# Patient Record
Sex: Female | Born: 1955
Health system: Southern US, Community
[De-identification: ages and names within clinical notes are randomized; demographics above are authoritative.]

## PROBLEM LIST (undated history)

## (undated) DIAGNOSIS — G8929 Other chronic pain: Secondary | ICD-10-CM

## (undated) DIAGNOSIS — E079 Disorder of thyroid, unspecified: Secondary | ICD-10-CM

## (undated) DIAGNOSIS — M549 Dorsalgia, unspecified: Secondary | ICD-10-CM

## (undated) DIAGNOSIS — I10 Essential (primary) hypertension: Secondary | ICD-10-CM

## (undated) HISTORY — PX: CHOLECYSTECTOMY: SHX55

## (undated) HISTORY — PX: TONSILECTOMY/ADENOIDECTOMY WITH MYRINGOTOMY: SHX6125

---

## 2001-05-30 ENCOUNTER — Emergency Department (HOSPITAL_COMMUNITY): Admission: EM | Admit: 2001-05-30 | Discharge: 2001-05-30 | Payer: Self-pay | Admitting: Emergency Medicine

## 2001-06-10 ENCOUNTER — Encounter: Payer: Self-pay | Admitting: Physical Medicine and Rehabilitation

## 2001-06-10 ENCOUNTER — Ambulatory Visit (HOSPITAL_COMMUNITY)
Admission: RE | Admit: 2001-06-10 | Discharge: 2001-06-10 | Payer: Self-pay | Admitting: Physical Medicine and Rehabilitation

## 2004-09-06 ENCOUNTER — Emergency Department (HOSPITAL_COMMUNITY): Admission: EM | Admit: 2004-09-06 | Discharge: 2004-09-06 | Payer: Self-pay | Admitting: Family Medicine

## 2006-07-06 ENCOUNTER — Emergency Department (HOSPITAL_COMMUNITY): Admission: EM | Admit: 2006-07-06 | Discharge: 2006-07-06 | Payer: Self-pay | Admitting: Emergency Medicine

## 2006-08-17 ENCOUNTER — Emergency Department (HOSPITAL_COMMUNITY): Admission: EM | Admit: 2006-08-17 | Discharge: 2006-08-17 | Payer: Self-pay | Admitting: Emergency Medicine

## 2007-06-06 ENCOUNTER — Encounter: Admission: RE | Admit: 2007-06-06 | Discharge: 2007-06-06 | Payer: Self-pay | Admitting: Internal Medicine

## 2007-08-20 ENCOUNTER — Emergency Department (HOSPITAL_COMMUNITY): Admission: EM | Admit: 2007-08-20 | Discharge: 2007-08-20 | Payer: Self-pay | Admitting: Emergency Medicine

## 2008-04-07 ENCOUNTER — Emergency Department (HOSPITAL_COMMUNITY): Admission: EM | Admit: 2008-04-07 | Discharge: 2008-04-07 | Payer: Self-pay | Admitting: Emergency Medicine

## 2013-02-15 ENCOUNTER — Emergency Department (HOSPITAL_COMMUNITY)
Admission: EM | Admit: 2013-02-15 | Discharge: 2013-02-16 | Disposition: A | Discharge status: Home / Self Care | Payer: No Typology Code available for payment source | Attending: Emergency Medicine | Admitting: Emergency Medicine

## 2013-02-15 DIAGNOSIS — Y9389 Activity, other specified: Secondary | ICD-10-CM | POA: Insufficient documentation

## 2013-02-15 DIAGNOSIS — IMO0002 Reserved for concepts with insufficient information to code with codable children: Secondary | ICD-10-CM | POA: Insufficient documentation

## 2013-02-15 DIAGNOSIS — Z862 Personal history of diseases of the blood and blood-forming organs and certain disorders involving the immune mechanism: Secondary | ICD-10-CM | POA: Insufficient documentation

## 2013-02-15 DIAGNOSIS — Z8639 Personal history of other endocrine, nutritional and metabolic disease: Secondary | ICD-10-CM | POA: Insufficient documentation

## 2013-02-15 DIAGNOSIS — Y92410 Unspecified street and highway as the place of occurrence of the external cause: Secondary | ICD-10-CM | POA: Insufficient documentation

## 2013-02-15 DIAGNOSIS — I10 Essential (primary) hypertension: Secondary | ICD-10-CM | POA: Insufficient documentation

## 2013-02-15 DIAGNOSIS — Z79899 Other long term (current) drug therapy: Secondary | ICD-10-CM | POA: Insufficient documentation

## 2013-02-15 DIAGNOSIS — M62838 Other muscle spasm: Secondary | ICD-10-CM | POA: Insufficient documentation

## 2013-02-15 DIAGNOSIS — M62830 Muscle spasm of back: Secondary | ICD-10-CM

## 2013-02-15 HISTORY — DX: Disorder of thyroid, unspecified: E07.9

## 2013-02-15 HISTORY — DX: Essential (primary) hypertension: I10
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
## 2013-02-16 ENCOUNTER — Encounter (HOSPITAL_COMMUNITY): Payer: Self-pay

## 2013-02-16 ENCOUNTER — Emergency Department (HOSPITAL_COMMUNITY): Payer: No Typology Code available for payment source

## 2013-02-16 MED ORDER — IBUPROFEN 800 MG PO TABS: 800.0000 mg | ORAL_TABLET | Freq: Three times a day (TID) | ORAL | Status: DC

## 2013-02-16 MED ORDER — BLISTEX EX OINT
TOPICAL_OINTMENT | CUTANEOUS | Status: DC | PRN
  Filled 2013-02-16: qty 10

## 2013-02-16 MED ORDER — DIAZEPAM 5 MG PO TABS: 5.0000 mg | ORAL_TABLET | Freq: Three times a day (TID) | ORAL | Status: DC | PRN

## 2013-02-16 MED ORDER — DIAZEPAM 5 MG PO TABS
5.0000 mg | ORAL_TABLET | Freq: Once | ORAL | Status: AC
  Administered 2013-02-16: 5 mg via ORAL
  Filled 2013-02-16: qty 1

## 2013-02-16 MED ORDER — ACETAMINOPHEN 325 MG PO TABS
650.0000 mg | ORAL_TABLET | Freq: Once | ORAL | Status: AC
  Administered 2013-02-16: 650 mg via ORAL
  Filled 2013-02-16: qty 2

## 2013-02-16 MED ORDER — LIP MEDEX EX OINT
TOPICAL_OINTMENT | CUTANEOUS | Status: DC | PRN
  Administered 2013-02-16: 01:00:00 via TOPICAL
  Filled 2013-02-16: qty 7
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
## 2013-03-02 ENCOUNTER — Emergency Department (HOSPITAL_COMMUNITY): Payer: No Typology Code available for payment source

## 2013-03-02 ENCOUNTER — Emergency Department (HOSPITAL_COMMUNITY)
Admission: EM | Admit: 2013-03-02 | Discharge: 2013-03-03 | Disposition: A | Discharge status: Home / Self Care | Payer: No Typology Code available for payment source | Attending: Emergency Medicine | Admitting: Emergency Medicine

## 2013-03-02 ENCOUNTER — Encounter (HOSPITAL_COMMUNITY): Payer: Self-pay

## 2013-03-02 DIAGNOSIS — I10 Essential (primary) hypertension: Secondary | ICD-10-CM | POA: Insufficient documentation

## 2013-03-02 DIAGNOSIS — R059 Cough, unspecified: Secondary | ICD-10-CM | POA: Insufficient documentation

## 2013-03-02 DIAGNOSIS — Z8639 Personal history of other endocrine, nutritional and metabolic disease: Secondary | ICD-10-CM | POA: Insufficient documentation

## 2013-03-02 DIAGNOSIS — R05 Cough: Secondary | ICD-10-CM | POA: Insufficient documentation

## 2013-03-02 DIAGNOSIS — S298XXA Other specified injuries of thorax, initial encounter: Secondary | ICD-10-CM | POA: Insufficient documentation

## 2013-03-02 DIAGNOSIS — S6990XA Unspecified injury of unspecified wrist, hand and finger(s), initial encounter: Secondary | ICD-10-CM | POA: Insufficient documentation

## 2013-03-02 DIAGNOSIS — R0789 Other chest pain: Secondary | ICD-10-CM

## 2013-03-02 DIAGNOSIS — Z79899 Other long term (current) drug therapy: Secondary | ICD-10-CM | POA: Insufficient documentation

## 2013-03-02 DIAGNOSIS — Z862 Personal history of diseases of the blood and blood-forming organs and certain disorders involving the immune mechanism: Secondary | ICD-10-CM | POA: Insufficient documentation

## 2013-03-02 DIAGNOSIS — Y9389 Activity, other specified: Secondary | ICD-10-CM | POA: Insufficient documentation

## 2013-03-02 DIAGNOSIS — Z7982 Long term (current) use of aspirin: Secondary | ICD-10-CM | POA: Insufficient documentation

## 2013-03-02 DIAGNOSIS — IMO0002 Reserved for concepts with insufficient information to code with codable children: Secondary | ICD-10-CM | POA: Insufficient documentation

## 2013-03-02 DIAGNOSIS — G8929 Other chronic pain: Secondary | ICD-10-CM | POA: Insufficient documentation

## 2013-03-02 DIAGNOSIS — M549 Dorsalgia, unspecified: Secondary | ICD-10-CM

## 2013-03-02 DIAGNOSIS — Y92410 Unspecified street and highway as the place of occurrence of the external cause: Secondary | ICD-10-CM | POA: Insufficient documentation

## 2013-03-02 DIAGNOSIS — M79622 Pain in left upper arm: Secondary | ICD-10-CM

## 2013-03-02 DIAGNOSIS — S59909A Unspecified injury of unspecified elbow, initial encounter: Secondary | ICD-10-CM | POA: Insufficient documentation

## 2013-03-02 HISTORY — DX: Other chronic pain: G89.29

## 2013-03-02 HISTORY — DX: Dorsalgia, unspecified: M54.9
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
## 2013-03-03 MED ORDER — IBUPROFEN 800 MG PO TABS: 800.0000 mg | ORAL_TABLET | Freq: Three times a day (TID) | ORAL | Status: DC

## 2013-03-03 MED ORDER — DIAZEPAM 5 MG PO TABS: 5.0000 mg | ORAL_TABLET | Freq: Two times a day (BID) | ORAL | Status: DC

## 2013-03-03 NOTE — ED Provider Notes (Signed)
Medical screening examination/treatment/procedure(s) were performed by non-physician practitioner and as supervising physician I was immediately available for consultation/collaboration.   Husam Hohn E Charlestine Rookstool, MD 03/03/13 2138 

## 2013-04-22 ENCOUNTER — Encounter (HOSPITAL_COMMUNITY): Payer: Self-pay | Admitting: Emergency Medicine

## 2013-04-22 ENCOUNTER — Emergency Department (HOSPITAL_COMMUNITY)
Admission: EM | Admit: 2013-04-22 | Discharge: 2013-04-22 | Disposition: A | Discharge status: Home / Self Care | Payer: No Typology Code available for payment source | Attending: Emergency Medicine | Admitting: Emergency Medicine

## 2013-04-22 ENCOUNTER — Emergency Department (HOSPITAL_COMMUNITY): Payer: No Typology Code available for payment source

## 2013-04-22 DIAGNOSIS — G8911 Acute pain due to trauma: Secondary | ICD-10-CM | POA: Insufficient documentation

## 2013-04-22 DIAGNOSIS — M25469 Effusion, unspecified knee: Secondary | ICD-10-CM | POA: Insufficient documentation

## 2013-04-22 DIAGNOSIS — M549 Dorsalgia, unspecified: Secondary | ICD-10-CM | POA: Insufficient documentation

## 2013-04-22 DIAGNOSIS — M25539 Pain in unspecified wrist: Secondary | ICD-10-CM | POA: Insufficient documentation

## 2013-04-22 DIAGNOSIS — M25562 Pain in left knee: Secondary | ICD-10-CM

## 2013-04-22 DIAGNOSIS — Z791 Long term (current) use of non-steroidal anti-inflammatories (NSAID): Secondary | ICD-10-CM | POA: Insufficient documentation

## 2013-04-22 DIAGNOSIS — M25532 Pain in left wrist: Secondary | ICD-10-CM

## 2013-04-22 DIAGNOSIS — Z79899 Other long term (current) drug therapy: Secondary | ICD-10-CM | POA: Insufficient documentation

## 2013-04-22 DIAGNOSIS — I10 Essential (primary) hypertension: Secondary | ICD-10-CM | POA: Insufficient documentation

## 2013-04-22 DIAGNOSIS — M25569 Pain in unspecified knee: Secondary | ICD-10-CM | POA: Insufficient documentation

## 2013-04-22 DIAGNOSIS — G8929 Other chronic pain: Secondary | ICD-10-CM | POA: Insufficient documentation

## 2013-04-22 NOTE — ED Provider Notes (Signed)
Medical screening examination/treatment/procedure(s) were performed by non-physician practitioner and as supervising physician I was immediately available for consultation/collaboration.    Celene Kras, MD 04/22/13 [phone]
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
## 2013-05-28 ENCOUNTER — Encounter (INDEPENDENT_AMBULATORY_CARE_PROVIDER_SITE_OTHER): Payer: Self-pay

## 2013-05-28 ENCOUNTER — Encounter: Payer: Self-pay | Admitting: Internal Medicine

## 2013-05-28 ENCOUNTER — Ambulatory Visit: Payer: Self-pay | Attending: Internal Medicine | Admitting: Internal Medicine

## 2013-05-28 VITALS — BP 134/91 | HR 84 | Temp 98.2°F | Resp 16 | Ht 63.0 in | Wt 170.0 lb

## 2013-05-28 DIAGNOSIS — S62002S Unspecified fracture of navicular [scaphoid] bone of left wrist, sequela: Secondary | ICD-10-CM

## 2013-05-28 DIAGNOSIS — I10 Essential (primary) hypertension: Secondary | ICD-10-CM

## 2013-05-28 DIAGNOSIS — R42 Dizziness and giddiness: Secondary | ICD-10-CM

## 2013-05-28 DIAGNOSIS — S62009A Unspecified fracture of navicular [scaphoid] bone of unspecified wrist, initial encounter for closed fracture: Secondary | ICD-10-CM | POA: Insufficient documentation

## 2013-05-28 DIAGNOSIS — S42309S Unspecified fracture of shaft of humerus, unspecified arm, sequela: Secondary | ICD-10-CM

## 2013-05-28 LAB — CBC WITH DIFFERENTIAL/PLATELET
Eosinophils Absolute: 0.2 10*3/uL (ref 0.0–0.7)
Eosinophils Relative: 3 % (ref 0–5)
HCT: 35.7 % — ABNORMAL LOW (ref 36.0–46.0)
Lymphs Abs: 1.7 10*3/uL (ref 0.7–4.0)
MCH: 29.5 pg (ref 26.0–34.0)
MCV: 81.5 fL (ref 78.0–100.0)
Monocytes Absolute: 0.3 10*3/uL (ref 0.1–1.0)
Monocytes Relative: 5 % (ref 3–12)
Platelets: 307 10*3/uL (ref 150–400)
RBC: 4.38 MIL/uL (ref 3.87–5.11)

## 2013-05-28 LAB — CMP AND LIVER
ALT: 11 U/L (ref 0–35)
AST: 17 U/L (ref 0–37)
Albumin: 4.4 g/dL (ref 3.5–5.2)
Calcium: 9.6 mg/dL (ref 8.4–10.5)
Chloride: 104 mEq/L (ref 96–112)
Creat: 0.68 mg/dL (ref 0.50–1.10)
Potassium: 3.5 mEq/L (ref 3.5–5.3)

## 2013-05-28 LAB — T4, FREE: Free T4: 0.9 ng/dL (ref 0.80–1.80)

## 2013-05-28 LAB — TSH: TSH: 1.444 u[IU]/mL (ref 0.350–4.500)
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
## 2013-05-28 NOTE — Patient Instructions (Signed)
Back Pain, Adult Low back pain is very common. About 1 in 5 people have back pain.The cause of low back pain is rarely dangerous. The pain often gets better over time.About half of people with a sudden onset of back pain feel better in just 2 weeks. About 8 in 10 people feel better by 6 weeks.  CAUSES Some common causes of back pain include:  Strain of the muscles or ligaments supporting the spine.  Wear and tear (degeneration) of the spinal discs.  Arthritis.  Direct injury to the back. DIAGNOSIS Most of the time, the direct cause of low back pain is not known.However, back pain can be treated effectively even when the exact cause of the pain is unknown.Answering your caregiver's questions about your overall health and symptoms is one of the most accurate ways to make sure the cause of your pain is not dangerous. If your caregiver needs more information, he or she may order lab work or imaging tests (X-rays or MRIs).However, even if imaging tests show changes in your back, this usually does not require surgery. HOME CARE INSTRUCTIONS For many people, back pain returns.Since low back pain is rarely dangerous, it is often a condition that people can learn to manageon their own.   Remain active. It is stressful on the back to sit or stand in one place. Do not sit, drive, or stand in one place for more than 30 minutes at a time. Take short walks on level surfaces as soon as pain allows.Try to increase the length of time you walk each day.  Do not stay in bed.Resting more than 1 or 2 days can delay your recovery.  Do not avoid exercise or work.Your body is made to move.It is not dangerous to be active, even though your back may hurt.Your back will likely heal faster if you return to being active before your pain is gone.  Pay attention to your body when you bend and lift. Many people have less discomfortwhen lifting if they bend their knees, keep the load close to their bodies,and  avoid twisting. Often, the most comfortable positions are those that put less stress on your recovering back.  Find a comfortable position to sleep. Use a firm mattress and lie on your side with your knees slightly bent. If you lie on your back, put a pillow under your knees.  Only take over-the-counter or prescription medicines as directed by your caregiver. Over-the-counter medicines to reduce pain and inflammation are often the most helpful.Your caregiver may prescribe muscle relaxant drugs.These medicines help dull your pain so you can more quickly return to your normal activities and healthy exercise.  Put ice on the injured area.  Put ice in a plastic bag.  Place a towel between your skin and the bag.  Leave the ice on for 15-20 minutes, 03-04 times a day for the first 2 to 3 days. After that, ice and heat may be alternated to reduce pain and spasms.  Ask your caregiver about trying back exercises and gentle massage. This may be of some benefit.  Avoid feeling anxious or stressed.Stress increases muscle tension and can worsen back pain.It is important to recognize when you are anxious or stressed and learn ways to manage it.Exercise is a great option. SEEK MEDICAL CARE IF:  You have pain that is not relieved with rest or medicine.  You have pain that does not improve in 1 week.  You have new symptoms.  You are generally not feeling well. SEEK   IMMEDIATE MEDICAL CARE IF:   You have pain that radiates from your back into your legs.  You develop new bowel or bladder control problems.  You have unusual weakness or numbness in your arms or legs.  You develop nausea or vomiting.  You develop abdominal pain.  You feel faint. Document Released: 07/09/2005 Document Revised: 01/08/2012 Document Reviewed: 11/27/2010 [facility] Patient Information 2014 [place], [place]. Exercise to Lose Weight Exercise and a healthy diet may help you lose weight. Your doctor may suggest specific  exercises. EXERCISE IDEAS AND TIPS  Choose low-cost things you enjoy doing, such as walking, bicycling, or exercising to workout videos.  Take stairs instead of the elevator.  Walk during your lunch break.  Park your car further away from work or school.  Go to a gym or an exercise class.  Start with 5 to 10 minutes of exercise each day. Build up to 30 minutes of exercise 4 to 6 days a week.  Wear shoes with good support and comfortable clothes.  Stretch before and after working out.  Work out until you breathe harder and your heart beats faster.  Drink extra water when you exercise.  Do not do so much that you hurt yourself, feel dizzy, or get very short of breath. Exercises that burn about 150 calories:  Running 1  miles in 15 minutes.  Playing volleyball for 45 to 60 minutes.  Washing and waxing a car for 45 to 60 minutes.  Playing touch football for 45 minutes.  Walking 1  miles in 35 minutes.  Pushing a stroller 1  miles in 30 minutes.  Playing basketball for 30 minutes.  Raking leaves for 30 minutes.  Bicycling 5 miles in 30 minutes.  Walking 2 miles in 30 minutes.  Dancing for 30 minutes.  Shoveling snow for 15 minutes.  Swimming laps for 20 minutes.  Walking up stairs for 15 minutes.  Bicycling 4 miles in 15 minutes.  Gardening for 30 to 45 minutes.  Jumping rope for 15 minutes.  Washing windows or floors for 45 to 60 minutes. Document Released: 08/11/2010 Document Revised: 10/01/2011 Document Reviewed: 08/11/2010 [facility] Patient Information 2014 [place], [place]. Hypertension As your heart beats, it forces blood through your arteries. This force is your blood pressure. If the pressure is too high, it is called hypertension (HTN) or high blood pressure. HTN is dangerous because you may have it and not know it. High blood pressure may mean that your heart has to work harder to pump blood. Your arteries may be narrow or stiff. The extra work  puts you at risk for heart disease, stroke, and other problems.  Blood pressure consists of two numbers, a higher number over a lower, 110/72, for example. It is stated as "110 over 72." The ideal is below 120 for the top number (systolic) and under 80 for the bottom (diastolic). Write down your blood pressure today. You should pay close attention to your blood pressure if you have certain conditions such as:  Heart failure.  Prior heart attack.  Diabetes  Chronic kidney disease.  Prior stroke.  Multiple risk factors for heart disease. To see if you have HTN, your blood pressure should be measured while you are seated with your arm held at the level of the heart. It should be measured at least twice. A one-time elevated blood pressure reading (especially in the Emergency Department) does not mean that you need treatment. There may be conditions in which the blood pressure is different between  your right and left arms. It is important to see your caregiver soon for a recheck. Most people have essential hypertension which means that there is not a specific cause. This type of high blood pressure may be lowered by changing lifestyle factors such as:  Stress.  Smoking.  Lack of exercise.  Excessive weight.  Drug/tobacco/alcohol use.  Eating less salt. Most people do not have symptoms from high blood pressure until it has caused damage to the body. Effective treatment can often prevent, delay or reduce that damage. TREATMENT  When a cause has been identified, treatment for high blood pressure is directed at the cause. There are a large number of medications to treat HTN. These fall into several categories, and your caregiver will help you select the medicines that are best for you. Medications may have side effects. You should review side effects with your caregiver. If your blood pressure stays high after you have made lifestyle changes or started on medicines,   Your medication(s) may  need to be changed.  Other problems may need to be addressed.  Be certain you understand your prescriptions, and know how and when to take your medicine.  Be sure to follow up with your caregiver within the time frame advised (usually within two weeks) to have your blood pressure rechecked and to review your medications.  If you are taking more than one medicine to lower your blood pressure, make sure you know how and at what times they should be taken. Taking two medicines at the same time can result in blood pressure that is too low. SEEK IMMEDIATE MEDICAL CARE IF:  You develop a severe headache, blurred or changing vision, or confusion.  You have unusual weakness or numbness, or a faint feeling.  You have severe chest or abdominal pain, vomiting, or breathing problems. MAKE SURE YOU:   Understand these instructions.  Will watch your condition.  Will get help right away if you are not doing well or get worse. Document Released: 07/09/2005 Document Revised: 10/01/2011 Document Reviewed: 02/27/2008 [facility] Patient Information 2014 [place], [place].

## 2013-05-29 ENCOUNTER — Telehealth: Payer: Self-pay | Admitting: Emergency Medicine

## 2013-05-29 LAB — URINALYSIS, COMPLETE
Bacteria, UA: NONE SEEN
Bilirubin Urine: NEGATIVE
Casts: NONE SEEN
Crystals: NONE SEEN
Glucose, UA: NEGATIVE mg/dL
Ketones, ur: NEGATIVE mg/dL
Specific Gravity, Urine: 1.012 (ref 1.005–1.030)
Squamous Epithelial / LPF: NONE SEEN
pH: 6 (ref 5.0–8.0)

## 2013-05-29 MED ORDER — VITAMIN D (ERGOCALCIFEROL) 1.25 MG (50000 UNIT) PO CAPS: 50000.0000 [IU] | ORAL_CAPSULE | ORAL | Status: AC

## 2013-05-29 NOTE — Telephone Encounter (Signed)
Attempted to call pt with results but no answer. Will retry on both lines listed

## 2013-05-29 NOTE — Telephone Encounter (Signed)
Message copied by Darlis Loan on Fri May 29, 2013  4:14 PM ------      Message from: Quentin Angst      Created: Fri May 29, 2013 12:33 PM       Please inform patient that most of her lab results are within normal limit. Her thyroid function is normal. However her vitamin D level is low, will need to start supplements with ergocalciferol capsule 50,000 unit weekly for 8 weeks. Please call in the prescription ------

## 2013-06-01 ENCOUNTER — Telehealth: Payer: Self-pay | Admitting: Emergency Medicine

## 2013-06-01 NOTE — Telephone Encounter (Signed)
Pt called requesting lab results. Pt given results and told to pick script Vitamin D med up from pharmacy

## 2013-06-26 ENCOUNTER — Ambulatory Visit: Payer: Self-pay | Attending: Internal Medicine | Admitting: Internal Medicine

## 2013-06-26 ENCOUNTER — Encounter: Payer: Self-pay | Admitting: Internal Medicine

## 2013-06-26 VITALS — BP 128/87 | HR 78 | Temp 98.7°F | Resp 14 | Ht 62.0 in | Wt 169.4 lb

## 2013-06-26 DIAGNOSIS — M25539 Pain in unspecified wrist: Secondary | ICD-10-CM | POA: Insufficient documentation

## 2013-06-26 DIAGNOSIS — G894 Chronic pain syndrome: Secondary | ICD-10-CM

## 2013-06-26 DIAGNOSIS — M25519 Pain in unspecified shoulder: Secondary | ICD-10-CM | POA: Insufficient documentation

## 2013-06-26 MED ORDER — MELOXICAM 15 MG PO TABS: 15.0000 mg | ORAL_TABLET | Freq: Every day | ORAL | Status: DC

## 2013-06-26 MED ORDER — DULOXETINE HCL 30 MG PO CPEP: 30.0000 mg | ORAL_CAPSULE | Freq: Every day | ORAL | Status: DC

## 2013-06-26 NOTE — Progress Notes (Signed)
Pt is here for a f/u visit. Complain of Lt breast or chest pain x5 months; comes and goes. Recent chest xray determined muscle pull. Lt shoulder pain. Only taking BC for pain. Have to sit down after doing heavy work.
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
## 2013-06-29 ENCOUNTER — Telehealth: Payer: Self-pay

## 2013-06-29 NOTE — Telephone Encounter (Signed)
Sherry from [facility] called  Stated they did not receive specimen in an SST Unable to run the lab Reference # [id number]

## 2013-06-30 ENCOUNTER — Telehealth: Payer: Self-pay | Admitting: Internal Medicine

## 2013-06-30 NOTE — Telephone Encounter (Signed)
Pt called regarding her INR, Pt would like a call back from the Nurse

## 2013-07-01 ENCOUNTER — Telehealth: Payer: Self-pay | Admitting: Emergency Medicine

## 2013-07-01 ENCOUNTER — Telehealth: Payer: Self-pay

## 2013-07-01 NOTE — Telephone Encounter (Signed)
Spoke with miss Courts this am She is aware of her scheduled appt at radiology On 07/09/13 at 3pm Arrive 2:45

## 2013-07-01 NOTE — Telephone Encounter (Signed)
Pt given CT/MRI scheduling number

## 2013-07-02 ENCOUNTER — Ambulatory Visit: Payer: Self-pay | Admitting: Internal Medicine

## 2013-07-09 ENCOUNTER — Ambulatory Visit (HOSPITAL_COMMUNITY): Payer: No Typology Code available for payment source

## 2013-07-09 ENCOUNTER — Ambulatory Visit (HOSPITAL_COMMUNITY)
Admission: RE | Admit: 2013-07-09 | Discharge: 2013-07-09 | Disposition: A | Discharge status: Home / Self Care | Payer: No Typology Code available for payment source | Source: Ambulatory Visit | Attending: Internal Medicine | Admitting: Internal Medicine

## 2013-07-09 ENCOUNTER — Ambulatory Visit: Payer: Self-pay

## 2013-07-09 DIAGNOSIS — G894 Chronic pain syndrome: Secondary | ICD-10-CM

## 2013-07-13 ENCOUNTER — Other Ambulatory Visit (HOSPITAL_COMMUNITY): Payer: Self-pay | Admitting: *Deleted

## 2013-07-13 DIAGNOSIS — N644 Mastodynia: Secondary | ICD-10-CM

## 2013-07-14 ENCOUNTER — Ambulatory Visit: Payer: Self-pay | Admitting: Internal Medicine

## 2013-07-20 ENCOUNTER — Telehealth: Payer: Self-pay | Admitting: Internal Medicine

## 2013-07-20 DIAGNOSIS — M545 Low back pain: Secondary | ICD-10-CM

## 2013-07-20 NOTE — Telephone Encounter (Signed)
Pt called in today to see if she can have an order put in to [facility] imaging for an open screening; pt said she could not handle the closed MRI and would like to try this method instead

## 2013-07-21 ENCOUNTER — Encounter (HOSPITAL_COMMUNITY): Payer: Self-pay

## 2013-07-21 ENCOUNTER — Other Ambulatory Visit (HOSPITAL_COMMUNITY)
Admission: RE | Admit: 2013-07-21 | Discharge: 2013-07-21 | Disposition: A | Discharge status: Home / Self Care | Payer: No Typology Code available for payment source | Source: Ambulatory Visit | Attending: Obstetrics and Gynecology | Admitting: Obstetrics and Gynecology

## 2013-07-21 ENCOUNTER — Ambulatory Visit (HOSPITAL_COMMUNITY)
Admission: RE | Admit: 2013-07-21 | Discharge: 2013-07-21 | Disposition: A | Discharge status: Home / Self Care | Payer: No Typology Code available for payment source | Source: Ambulatory Visit | Attending: Obstetrics and Gynecology | Admitting: Obstetrics and Gynecology

## 2013-07-21 VITALS — BP 128/84 | Temp 98.0°F | Ht 62.5 in | Wt 169.4 lb

## 2013-07-21 DIAGNOSIS — Z01419 Encounter for gynecological examination (general) (routine) without abnormal findings: Secondary | ICD-10-CM
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
## 2013-07-21 NOTE — Patient Instructions (Signed)
Taught Jade Cunningham how to perform BSE and gave educational materials to take home. Let her know [facility] will cover Pap smears every 3 years unless has a history of abnormal Pap smears. Referred patient to the [facility] for diagnostic mammogram. Appointment scheduled for Monday, July 27, 2013 at 1345. Patient aware of appointment and will be there. Let patient know will follow up with her within the next couple weeks with results by phone. Theodoro Clock verbalized understanding.  Kamilia Carollo, Kathaleen Maser, RN 2:43 PM

## 2013-07-27 ENCOUNTER — Ambulatory Visit
Admission: RE | Admit: 2013-07-27 | Discharge: 2013-07-27 | Disposition: A | Discharge status: Home / Self Care | Payer: No Typology Code available for payment source | Source: Ambulatory Visit | Attending: Obstetrics and Gynecology | Admitting: Obstetrics and Gynecology

## 2013-07-27 DIAGNOSIS — N644 Mastodynia: Secondary | ICD-10-CM

## 2013-07-31 ENCOUNTER — Telehealth: Payer: Self-pay

## 2013-07-31 NOTE — Telephone Encounter (Signed)
Please review lab results Patient has called for results

## 2013-08-03 ENCOUNTER — Other Ambulatory Visit: Payer: Self-pay

## 2013-08-05 NOTE — Telephone Encounter (Signed)
Called and informed that her PAP smear was negative. I discussed the  Importance of going ahead and finishing paperwork for orange card.

## 2013-08-12 ENCOUNTER — Telehealth: Payer: Self-pay | Admitting: Emergency Medicine

## 2013-08-12 MED ORDER — LORAZEPAM 1 MG PO TABS: 1.0000 mg | ORAL_TABLET | Freq: Once | ORAL | Status: DC

## 2013-08-12 NOTE — Addendum Note (Signed)
Addended by: Susie CassetteABROL MD, Germain OsgoodNAYANA on: 08/12/2013 10:53 AM   Modules accepted: Orders

## 2013-08-12 NOTE — Telephone Encounter (Signed)
Spoke with pt regarding upcoming MRI. States she needs open mri due to claustra phobia. Spoke with Dr. Susie CassetteAbrol, Ativan one dose ordered for scheduled test 08/24/13 @ 1145. Informed pt to pick medication up here prior to test. Pt verbalized understanding

## 2013-08-24 ENCOUNTER — Ambulatory Visit (HOSPITAL_COMMUNITY)
Admission: RE | Admit: 2013-08-24 | Discharge: 2013-08-24 | Disposition: A | Discharge status: Home / Self Care | Payer: No Typology Code available for payment source | Source: Ambulatory Visit | Attending: Internal Medicine | Admitting: Internal Medicine

## 2013-08-24 ENCOUNTER — Other Ambulatory Visit: Payer: Self-pay | Admitting: Emergency Medicine

## 2013-08-24 DIAGNOSIS — M47814 Spondylosis without myelopathy or radiculopathy, thoracic region: Secondary | ICD-10-CM | POA: Insufficient documentation

## 2013-08-24 DIAGNOSIS — M47817 Spondylosis without myelopathy or radiculopathy, lumbosacral region: Secondary | ICD-10-CM | POA: Insufficient documentation

## 2013-08-24 DIAGNOSIS — M4802 Spinal stenosis, cervical region: Secondary | ICD-10-CM | POA: Insufficient documentation

## 2013-08-24 DIAGNOSIS — G894 Chronic pain syndrome: Secondary | ICD-10-CM

## 2013-08-24 DIAGNOSIS — M5126 Other intervertebral disc displacement, lumbar region: Secondary | ICD-10-CM | POA: Insufficient documentation

## 2013-08-24 MED ORDER — LORAZEPAM 1 MG PO TABS
1.0000 mg | ORAL_TABLET | Freq: Once | ORAL | Status: AC
Start: 2013-08-24 — End: 2013-08-24
  Administered 2013-08-24: 1 mg via ORAL
  Filled 2013-08-24: qty 1

## 2013-08-24 MED ORDER — LORAZEPAM 1 MG PO TABS: 1.0000 mg | ORAL_TABLET | Freq: Once | ORAL | Status: DC

## 2013-08-25 NOTE — Addendum Note (Signed)
Addended by: Susie CassetteABROL MD, Germain OsgoodNAYANA on: 08/25/2013 09:48 AM   Modules accepted: Orders

## 2013-09-28 ENCOUNTER — Encounter (HOSPITAL_COMMUNITY): Payer: Self-pay

## 2013-11-17 ENCOUNTER — Ambulatory Visit: Payer: No Typology Code available for payment source | Attending: Internal Medicine

## 2013-11-20 ENCOUNTER — Encounter: Payer: Self-pay | Admitting: Family Medicine

## 2013-11-20 ENCOUNTER — Ambulatory Visit: Payer: Self-pay | Attending: Internal Medicine | Admitting: Family Medicine

## 2013-11-20 VITALS — BP 162/106 | HR 82 | Temp 98.4°F | Resp 20 | Ht 62.0 in | Wt 170.0 lb

## 2013-11-20 DIAGNOSIS — M545 Low back pain, unspecified: Secondary | ICD-10-CM | POA: Insufficient documentation

## 2013-11-20 DIAGNOSIS — R51 Headache: Secondary | ICD-10-CM | POA: Insufficient documentation

## 2013-11-20 DIAGNOSIS — I10 Essential (primary) hypertension: Secondary | ICD-10-CM | POA: Insufficient documentation

## 2013-11-20 DIAGNOSIS — Z79899 Other long term (current) drug therapy: Secondary | ICD-10-CM | POA: Insufficient documentation

## 2013-11-20 DIAGNOSIS — Z87828 Personal history of other (healed) physical injury and trauma: Secondary | ICD-10-CM | POA: Insufficient documentation

## 2013-11-20 DIAGNOSIS — M549 Dorsalgia, unspecified: Secondary | ICD-10-CM

## 2013-11-20 MED ORDER — GABAPENTIN 100 MG PO CAPS: 100.0000 mg | ORAL_CAPSULE | Freq: Every day | ORAL | Status: AC

## 2013-11-20 MED ORDER — VALSARTAN-HYDROCHLOROTHIAZIDE 80-12.5 MG PO TABS: 1.0000 | ORAL_TABLET | Freq: Every day | ORAL | Status: DC

## 2013-11-20 NOTE — Patient Instructions (Addendum)
For pain, you will be referred to Physical Therapy. You will be started on low dose of Gabapentin 100 mg.  Take one at night initially. Slowly build up until you are able to take 3 pills at night. Return to clinic in one month.  For your blood pressure it is really important to take your medication every day to prevent heart attacks and strokes  Good to see you today!  Thanks for coming in.
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
## 2013-11-20 NOTE — Progress Notes (Signed)
Pt here for chronic lumbar pain unrelieved by multiple medications and treatments States she needs neurology referral [facility] due to cost. States she went to neurologist and paid 200 out of pocket without treatment Pain is constant,nonradiating Need medication refill BP med. States she has been taking EOD to save.  BP elevated 162/106 82 headaches  noted but no dizziness Pt seems more concerned with back issues then blood pressure management
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
## 2013-12-07 ENCOUNTER — Ambulatory Visit: Payer: No Typology Code available for payment source | Attending: Family Medicine

## 2013-12-07 DIAGNOSIS — M25559 Pain in unspecified hip: Secondary | ICD-10-CM | POA: Insufficient documentation

## 2013-12-07 DIAGNOSIS — M545 Low back pain, unspecified: Secondary | ICD-10-CM | POA: Insufficient documentation

## 2013-12-07 DIAGNOSIS — IMO0001 Reserved for inherently not codable concepts without codable children: Secondary | ICD-10-CM | POA: Insufficient documentation

## 2013-12-07 DIAGNOSIS — M6281 Muscle weakness (generalized): Secondary | ICD-10-CM | POA: Insufficient documentation

## 2013-12-07 DIAGNOSIS — R5381 Other malaise: Secondary | ICD-10-CM | POA: Insufficient documentation

## 2013-12-09 ENCOUNTER — Ambulatory Visit: Payer: No Typology Code available for payment source | Admitting: Rehabilitation

## 2013-12-16 ENCOUNTER — Ambulatory Visit: Payer: No Typology Code available for payment source | Admitting: Rehabilitation

## 2013-12-18 ENCOUNTER — Ambulatory Visit: Payer: No Typology Code available for payment source

## 2013-12-22 ENCOUNTER — Ambulatory Visit: Payer: No Typology Code available for payment source | Attending: Family Medicine

## 2013-12-22 DIAGNOSIS — Z5189 Encounter for other specified aftercare: Secondary | ICD-10-CM | POA: Insufficient documentation

## 2013-12-22 DIAGNOSIS — M545 Low back pain, unspecified: Secondary | ICD-10-CM | POA: Insufficient documentation

## 2013-12-22 DIAGNOSIS — R5381 Other malaise: Secondary | ICD-10-CM | POA: Insufficient documentation

## 2013-12-22 DIAGNOSIS — M25559 Pain in unspecified hip: Secondary | ICD-10-CM | POA: Insufficient documentation

## 2013-12-22 DIAGNOSIS — M6281 Muscle weakness (generalized): Secondary | ICD-10-CM | POA: Insufficient documentation

## 2013-12-24 ENCOUNTER — Encounter (INDEPENDENT_AMBULATORY_CARE_PROVIDER_SITE_OTHER): Payer: Self-pay

## 2013-12-24 ENCOUNTER — Ambulatory Visit: Payer: No Typology Code available for payment source | Admitting: Rehabilitation

## 2013-12-24 ENCOUNTER — Encounter: Payer: Self-pay | Admitting: Internal Medicine

## 2013-12-24 ENCOUNTER — Ambulatory Visit: Payer: Self-pay | Attending: Internal Medicine | Admitting: Internal Medicine

## 2013-12-24 VITALS — BP 140/82 | HR 67 | Temp 98.3°F | Resp 16 | Ht 62.0 in | Wt 172.0 lb

## 2013-12-24 DIAGNOSIS — Z885 Allergy status to narcotic agent status: Secondary | ICD-10-CM | POA: Insufficient documentation

## 2013-12-24 DIAGNOSIS — Z888 Allergy status to other drugs, medicaments and biological substances status: Secondary | ICD-10-CM | POA: Insufficient documentation

## 2013-12-24 DIAGNOSIS — I10 Essential (primary) hypertension: Secondary | ICD-10-CM | POA: Insufficient documentation

## 2013-12-24 DIAGNOSIS — M549 Dorsalgia, unspecified: Secondary | ICD-10-CM | POA: Insufficient documentation

## 2013-12-24 DIAGNOSIS — G894 Chronic pain syndrome: Secondary | ICD-10-CM | POA: Insufficient documentation

## 2013-12-24 DIAGNOSIS — E079 Disorder of thyroid, unspecified: Secondary | ICD-10-CM | POA: Insufficient documentation

## 2013-12-24 MED ORDER — CYCLOBENZAPRINE HCL 10 MG PO TABS: 10.0000 mg | ORAL_TABLET | Freq: Three times a day (TID) | ORAL | Status: DC | PRN

## 2013-12-24 NOTE — Progress Notes (Signed)
Pt here for blood pressure check with medication management Blood pressure stable on Diovan C/o lower back. Being seen by outpt Rehab Refuses surgery or injection for chronic pain BP- 140/82

## 2013-12-24 NOTE — Patient Instructions (Signed)
Back Pain, Adult Low back pain is very common. About 1 in 5 people have back pain.The cause of low back pain is rarely dangerous. The pain often gets better over time.About half of people with a sudden onset of back pain feel better in just 2 weeks. About 8 in 10 people feel better by 6 weeks.  CAUSES Some common causes of back pain include:  Strain of the muscles or ligaments supporting the spine.  Wear and tear (degeneration) of the spinal discs.  Arthritis.  Direct injury to the back. DIAGNOSIS Most of the time, the direct cause of low back pain is not known.However, back pain can be treated effectively even when the exact cause of the pain is unknown.Answering your caregiver's questions about your overall health and symptoms is one of the most accurate ways to make sure the cause of your pain is not dangerous. If your caregiver needs more information, he or she may order lab work or imaging tests (X-rays or MRIs).However, even if imaging tests show changes in your back, this usually does not require surgery. HOME CARE INSTRUCTIONS For many people, back pain returns.Since low back pain is rarely dangerous, it is often a condition that people can learn to manageon their own.   Remain active. It is stressful on the back to sit or stand in one place. Do not sit, drive, or stand in one place for more than 30 minutes at a time. Take short walks on level surfaces as soon as pain allows.Try to increase the length of time you walk each day.  Do not stay in bed.Resting more than 1 or 2 days can delay your recovery.  Do not avoid exercise or work.Your body is made to move.It is not dangerous to be active, even though your back may hurt.Your back will likely heal faster if you return to being active before your pain is gone.  Pay attention to your body when you bend and lift. Many people have less discomfortwhen lifting if they bend their knees, keep the load close to their bodies,and  avoid twisting. Often, the most comfortable positions are those that put less stress on your recovering back.  Find a comfortable position to sleep. Use a firm mattress and lie on your side with your knees slightly bent. If you lie on your back, put a pillow under your knees.  Only take over-the-counter or prescription medicines as directed by your caregiver. Over-the-counter medicines to reduce pain and inflammation are often the most helpful.Your caregiver may prescribe muscle relaxant drugs.These medicines help dull your pain so you can more quickly return to your normal activities and healthy exercise.  Put ice on the injured area.  Put ice in a plastic bag.  Place a towel between your skin and the bag.  Leave the ice on for 15-20 minutes, 03-04 times a day for the first 2 to 3 days. After that, ice and heat may be alternated to reduce pain and spasms.  Ask your caregiver about trying back exercises and gentle massage. This may be of some benefit.  Avoid feeling anxious or stressed.Stress increases muscle tension and can worsen back pain.It is important to recognize when you are anxious or stressed and learn ways to manage it.Exercise is a great option. SEEK MEDICAL CARE IF:  You have pain that is not relieved with rest or medicine.  You have pain that does not improve in 1 week.  You have new symptoms.  You are generally not feeling well. SEEK   IMMEDIATE MEDICAL CARE IF:   You have pain that radiates from your back into your legs.  You develop new bowel or bladder control problems.  You have unusual weakness or numbness in your arms or legs.  You develop nausea or vomiting.  You develop abdominal pain.  You feel faint. Document Released: 07/09/2005 Document Revised: 01/08/2012 Document Reviewed: 11/27/2010 ExitCare Patient Information 2014 [employer].  

## 2013-12-24 NOTE — Progress Notes (Signed)
Patient ID: Jade Cunningham, female   DOB: 22-Aug-1955, 58 y.o.   MRN: [id number]   Jade Cunningham, is a 58 y.o. female  XMI:[id number]  MGN:[id number]  DOB - 04-19-1956  Chief Complaint  Patient presents with  . Follow-up  . Hypertension        Subjective:   Jade Cunningham is a 58 y.o. female here today for a follow up visit. Pt here for blood pressure check with medication management. Blood pressure stable on Diovan. C/o lower back. Being seen by outpt Rehab, Refuses surgery or injection for chronic pain. BP- 140/82 Patient has No headache, No chest pain, No abdominal pain - No Nausea, No new weakness tingling or numbness, No Cough - SOB.  Problem  Back Pain  Chronic Pain Syndrome    ALLERGIES: Allergies  Allergen Reactions  . 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 Nyquil Multi-Symptom [Pseudoeph-Doxylamine-Dm-Apap] Other (See Comments)    Trudie Buckler syndrome     PAST MEDICAL HISTORY: Past Medical History  Diagnosis Date  . Hypertension   . Thyroid disease   . Chronic back pain     MEDICATIONS AT HOME: Prior to Admission medications   Medication Sig Start Date End Date Taking? Authorizing Provider  COD LIVER OIL PO Take 1 capsule by mouth daily.   Yes Historical Provider, MD  gabapentin (NEURONTIN) 100 MG capsule Take 1 capsule (100 mg total) by mouth at bedtime. May increase by one capsule every 3-4 days until taking 3 every evening 11/20/13  Yes Carney Living, MD  valsartan-hydrochlorothiazide (DIOVAN-HCT) 80-12.5 MG per tablet Take 1 tablet by mouth daily. 11/20/13  Yes Carney Living, MD  Vitamin D, Ergocalciferol, (DRISDOL) 50000 UNITS CAPS capsule Take 1 capsule (50,000 Units total) by mouth every 7 (seven) days. 05/29/13  Yes Jeanann Lewandowsky, MD  acetaminophen (TYLENOL) 500 MG tablet Take 500 mg by mouth every 6 (six) hours as needed for pain.    Historical Provider, MD  cyclobenzaprine (FLEXERIL) 10 MG  tablet Take 1 tablet (10 mg total) by mouth 3 (three) times daily as needed for muscle spasms. 12/24/13   Jeanann Lewandowsky, MD     Objective:   Filed Vitals:   12/24/13 1724  BP: 140/82  Pulse: 67  Temp: 98.3 F (36.8 C)  TempSrc: Oral  Resp: 16  Height: 5\' 2"  (1.575 m)  Weight: 172 lb (78.019 kg)  SpO2: 100%    Exam General appearance : Awake, alert, not in any distress. Speech Clear. Not toxic looking HEENT: Atraumatic and Normocephalic, pupils equally reactive to light and accomodation Neck: supple, no JVD. No cervical lymphadenopathy.  Chest:Good air entry bilaterally, no added sounds  CVS: S1 S2 regular, no murmurs.  Abdomen: Bowel sounds present, Non tender and not distended with no gaurding, rigidity or rebound. Extremities: B/L Lower Ext shows no edema, both legs are warm to touch Neurology: Awake alert, and oriented X 3, CN II-XII intact, Non focal Skin:No Rash Wounds:N/A  Data Review No results found for this basename: HGBA1C     Assessment & Plan   1. Back pain Medication as prescribed  2. Chronic pain syndrome  - ANA - Acute Hep Panel & Hep B Surface Ab - cyclobenzaprine (FLEXERIL) 10 MG tablet; Take 1 tablet (10 mg total) by mouth 3 (three) times daily as needed for muscle spasms.  Dispense: 90 tablet; Refill: 3  Patient was counseled extensively about nutrition and exercise  Return in about  6 months (around 06/25/2014), or if symptoms worsen or fail to improve, for Follow up Pain and comorbidities.  The patient was given clear instructions to go to ER or return to medical center if symptoms don't improve, worsen or new problems develop. The patient verbalized understanding. The patient was told to call to get lab results if they haven't heard anything in the next week.   This note has been created with Education officer, environmentalDragon speech recognition software and smart phrase technology. Any transcriptional errors are unintentional.    Jeanann Lewandowskylugbemiga Dallen Bunte, MD, MHA, FACP,  [facility] [place], [place] [phone]   12/24/2013, 5:55 PM

## 2013-12-25 ENCOUNTER — Telehealth: Payer: Self-pay | Admitting: *Deleted

## 2013-12-25 LAB — ACUTE HEP PANEL AND HEP B SURFACE AB
HCV AB: NEGATIVE
HEP B C IGM: NONREACTIVE
Hep A IgM: NONREACTIVE
Hep B S Ab: NEGATIVE
Hepatitis B Surface Ag: NEGATIVE

## 2013-12-25 LAB — ANA: Anti Nuclear Antibody(ANA): NEGATIVE

## 2013-12-25 NOTE — Telephone Encounter (Signed)
Patient called stating she needs a note for her insurance company regarding the what type of back injury she has.

## 2013-12-25 NOTE — Telephone Encounter (Signed)
[name] to call back. Dr. Hyman Hopes is requesting more details about the exact need for the letter. Patient needs to have insurance company send [facility] a from of what is needed.

## 2013-12-25 NOTE — Telephone Encounter (Signed)
Please get more details from patient about the exact need for this letter, her insurance may need to send as a form to fill for her back pain.

## 2013-12-29 ENCOUNTER — Telehealth: Payer: Self-pay | Admitting: *Deleted

## 2013-12-29 NOTE — Telephone Encounter (Signed)
Patient notified of results. Discussed continuing physical therapy and pain medications to relieve back pain.

## 2013-12-29 NOTE — Telephone Encounter (Signed)
Message copied by Fredderick Severance on Tue Dec 29, 2013  1:03 PM ------      Message from: Quentin Angst      Created: Fri Dec 25, 2013  5:21 PM       Please inform patient that her autoimmune disease screening came back negative as well as her hepatitis panel also negative ------

## 2013-12-31 ENCOUNTER — Ambulatory Visit: Payer: Self-pay | Admitting: Internal Medicine

## 2014-01-05 ENCOUNTER — Ambulatory Visit: Payer: No Typology Code available for payment source | Admitting: Rehabilitation

## 2014-01-07 ENCOUNTER — Ambulatory Visit: Payer: No Typology Code available for payment source | Admitting: Rehabilitation

## 2014-01-11 ENCOUNTER — Ambulatory Visit: Payer: No Typology Code available for payment source | Admitting: Physical Therapy

## 2014-01-13 ENCOUNTER — Ambulatory Visit: Payer: No Typology Code available for payment source | Admitting: Physical Therapy

## 2014-01-20 ENCOUNTER — Telehealth: Payer: Self-pay | Admitting: *Deleted

## 2014-01-20 NOTE — Telephone Encounter (Signed)
Patient called to see if we can refer her to neurology again she is still having back pain and pain on her left leg. Patient was seen by neuro 09/2013. Please advise or follow up with patient.

## 2014-03-04 ENCOUNTER — Telehealth: Payer: Self-pay | Admitting: Internal Medicine

## 2014-03-04 DIAGNOSIS — S62002S Unspecified fracture of navicular [scaphoid] bone of left wrist, sequela: Secondary | ICD-10-CM

## 2014-03-04 NOTE — Telephone Encounter (Addendum)
Pt has been going to physical therapy for back pain and is requesting a referral to orthopedic to find out about other options.  Please f/u with pt, Pt available for call back until 5:00pm.

## 2014-03-05 ENCOUNTER — Telehealth: Payer: Self-pay | Admitting: Emergency Medicine

## 2014-03-05 NOTE — Telephone Encounter (Signed)
Pt called in requesting referral orthopedics for left hand pain with radiating to left arm with poor hand grasp. Please f/u

## 2014-03-15 ENCOUNTER — Ambulatory Visit: Payer: Self-pay | Attending: Internal Medicine | Admitting: Internal Medicine

## 2014-03-15 ENCOUNTER — Encounter: Payer: Self-pay | Admitting: Internal Medicine

## 2014-03-15 VITALS — BP 139/87 | HR 77 | Temp 98.4°F | Resp 18 | Ht 62.0 in | Wt 171.0 lb

## 2014-03-15 DIAGNOSIS — IMO0002 Reserved for concepts with insufficient information to code with codable children: Secondary | ICD-10-CM | POA: Insufficient documentation

## 2014-03-15 DIAGNOSIS — T148XXA Other injury of unspecified body region, initial encounter: Secondary | ICD-10-CM

## 2014-03-15 DIAGNOSIS — Z888 Allergy status to other drugs, medicaments and biological substances status: Secondary | ICD-10-CM | POA: Insufficient documentation

## 2014-03-15 DIAGNOSIS — M25532 Pain in left wrist: Secondary | ICD-10-CM

## 2014-03-15 DIAGNOSIS — M545 Low back pain, unspecified: Secondary | ICD-10-CM | POA: Insufficient documentation

## 2014-03-15 DIAGNOSIS — M25539 Pain in unspecified wrist: Secondary | ICD-10-CM | POA: Insufficient documentation

## 2014-03-15 DIAGNOSIS — E079 Disorder of thyroid, unspecified: Secondary | ICD-10-CM | POA: Insufficient documentation

## 2014-03-15 DIAGNOSIS — I10 Essential (primary) hypertension: Secondary | ICD-10-CM | POA: Insufficient documentation

## 2014-03-15 DIAGNOSIS — Z885 Allergy status to narcotic agent status: Secondary | ICD-10-CM | POA: Insufficient documentation

## 2014-03-15 MED ORDER — BACITRACIN 500 UNIT/GM EX OINT: 1.0000 "application " | TOPICAL_OINTMENT | Freq: Two times a day (BID) | CUTANEOUS | Status: DC
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
## 2014-03-15 NOTE — Progress Notes (Signed)
Pt stated has a  Bruce Tenderness open wound under lt breast,  Hx breast redaction in 1999

## 2014-03-25 ENCOUNTER — Ambulatory Visit
Admission: RE | Admit: 2014-03-25 | Discharge: 2014-03-25 | Disposition: A | Discharge status: Home / Self Care | Payer: No Typology Code available for payment source | Source: Ambulatory Visit | Attending: Sports Medicine | Admitting: Sports Medicine

## 2014-03-25 ENCOUNTER — Ambulatory Visit (INDEPENDENT_AMBULATORY_CARE_PROVIDER_SITE_OTHER): Payer: Self-pay | Admitting: Sports Medicine

## 2014-03-25 ENCOUNTER — Encounter: Payer: Self-pay | Admitting: Sports Medicine

## 2014-03-25 VITALS — BP 139/85 | Ht 62.5 in | Wt 171.0 lb

## 2014-03-25 DIAGNOSIS — S42309S Unspecified fracture of shaft of humerus, unspecified arm, sequela: Secondary | ICD-10-CM

## 2014-03-25 DIAGNOSIS — S62002S Unspecified fracture of navicular [scaphoid] bone of left wrist, sequela: Secondary | ICD-10-CM
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
## 2014-03-25 NOTE — Assessment & Plan Note (Signed)
Patient has noted scaphoid fracture based on previous x-rays that was poorly managed with a wrist splint and no surgical intervention. Based on her exam today was moderate to severe tenderness in the anatomical snuff box I suspect poor union of this mid scaphoid fracture and possible collapse. Recommended repeating x-rays to evaluate followed by discussion with orthopedic surgery about recommendations on intervention for surgery versus possible referral [facility] a [facility] a [facility].

## 2014-03-26 NOTE — Telephone Encounter (Deleted)
Left Wrist Xray Results:

## 2014-03-26 NOTE — Telephone Encounter (Signed)
Spoke with patient over the phone about Xray results showing no obvious scaphoid fracture. However, because she still have significant snuffbox pain and the previous concerning xray I recommend more definitive imaging with a CT of the wrist. Patient was agreeable with this plan.

## 2014-03-30 NOTE — Patient Instructions (Signed)
CT SCAN OF WRIST FRI 04/02/14 AT 1030AM [facility] [address] AT 10AM TO HAVE BLOOD DRAWN BEFORE THE 1030AM SCAN [phone]

## 2014-04-01 ENCOUNTER — Encounter: Payer: Self-pay | Admitting: *Deleted

## 2014-04-01 DIAGNOSIS — S62002S Unspecified fracture of navicular [scaphoid] bone of left wrist, sequela: Secondary | ICD-10-CM

## 2014-04-01 DIAGNOSIS — M25531 Pain in right wrist: Secondary | ICD-10-CM

## 2014-04-02 ENCOUNTER — Other Ambulatory Visit: Payer: Self-pay

## 2014-04-06 ENCOUNTER — Telehealth: Payer: Self-pay | Admitting: Internal Medicine

## 2014-04-06 ENCOUNTER — Other Ambulatory Visit: Payer: Self-pay | Admitting: *Deleted

## 2014-04-06 MED ORDER — DIAZEPAM 5 MG PO TABS: ORAL_TABLET | ORAL | Status: DC

## 2014-04-06 NOTE — Telephone Encounter (Signed)
Patient has come in today to drop off a medical release form and to request a medication to ease her stress for her closed MRI; please f/u with patient about her request as she will be having the MRI on Friday morning;

## 2014-04-06 NOTE — Telephone Encounter (Signed)
Pt requesting anxiety medication for MRI scheduled Friday 04/09/14

## 2014-04-09 ENCOUNTER — Ambulatory Visit
Admission: RE | Admit: 2014-04-09 | Discharge: 2014-04-09 | Disposition: A | Discharge status: Home / Self Care | Payer: No Typology Code available for payment source | Source: Ambulatory Visit | Attending: Sports Medicine | Admitting: Sports Medicine

## 2014-04-09 ENCOUNTER — Other Ambulatory Visit: Payer: Self-pay | Admitting: Sports Medicine

## 2014-04-09 DIAGNOSIS — M25531 Pain in right wrist: Secondary | ICD-10-CM

## 2014-04-09 DIAGNOSIS — S62002S Unspecified fracture of navicular [scaphoid] bone of left wrist, sequela: Secondary | ICD-10-CM

## 2014-04-09 MED ORDER — IOHEXOL 180 MG/ML  SOLN
1.5000 mL | Freq: Once | INTRAMUSCULAR | Status: AC | PRN
  Administered 2014-04-09: 1.5 mL via INTRA_ARTICULAR

## 2014-04-09 MED ORDER — IOHEXOL 180 MG/ML  SOLN: 1.5000 mL | Freq: Once | INTRAMUSCULAR | Status: DC | PRN

## 2014-04-09 NOTE — Addendum Note (Signed)
Addended by: Alonza Smoker on: 04/09/2014 02:35 PM   Modules accepted: Orders

## 2014-04-13 NOTE — Telephone Encounter (Signed)
Call to discuss

## 2014-04-13 NOTE — Telephone Encounter (Signed)
Spoke with the patient over the phone concerning her MRI results. Notify the patient that she had evidence of small tear in the  TFC disc and irregularity of the scapholunate ligament. Given these results and the time line of her injury being over a year ago recommend referral to a hand specialist. Patient was provided with information for orthopedic hand specialist [facility] and financial assistance contact information as well. Patient verbalized understanding that there is typically up from the bottom $150 to be seen. She was agreeable with this in order to initiate some treatment to allow her to return to work.

## 2014-04-19 ENCOUNTER — Telehealth: Payer: Self-pay | Admitting: Internal Medicine

## 2014-04-19 ENCOUNTER — Other Ambulatory Visit: Payer: Self-pay | Admitting: Emergency Medicine

## 2014-04-19 ENCOUNTER — Telehealth: Payer: Self-pay | Admitting: Emergency Medicine

## 2014-04-19 MED ORDER — VALSARTAN-HYDROCHLOROTHIAZIDE 80-12.5 MG PO TABS: 1.0000 | ORAL_TABLET | Freq: Every day | ORAL | Status: DC

## 2014-04-19 NOTE — Telephone Encounter (Signed)
Patient has come in today requesting a medication refill for valsartan-hydrochlorothiazide (DIOVAN-HCT) 80-12.5 MG per tablet ; please f/u with patient about this request, patient stated that she has already asked PCP about this script and is waiting for a reply; please f/u via phone -[phone] or [phone]

## 2014-04-19 NOTE — Telephone Encounter (Signed)
Medication reordered and e-scribed to [facility] pharmacy Pt made aware

## 2014-04-28 ENCOUNTER — Telehealth: Payer: Self-pay | Admitting: Internal Medicine

## 2014-04-28 NOTE — Telephone Encounter (Signed)
Patient has called in today to inform PCP or nurse that she has experienced nausea and fatigue after she received a cortisone shot and her wrist as well as starting her new BP medication valsartan-hydrochlorothiazide (DIOVAN-HCT) 80-12.5 MG per tablet; Please f/u with patient about her Sx

## 2014-04-29 ENCOUNTER — Encounter: Payer: Self-pay | Admitting: Internal Medicine

## 2014-04-29 ENCOUNTER — Ambulatory Visit: Payer: No Typology Code available for payment source | Attending: Internal Medicine | Admitting: Internal Medicine

## 2014-04-29 VITALS — BP 162/85 | HR 75 | Temp 97.7°F | Resp 16 | Ht 62.5 in | Wt 169.6 lb

## 2014-04-29 DIAGNOSIS — I10 Essential (primary) hypertension: Secondary | ICD-10-CM | POA: Insufficient documentation

## 2014-04-29 DIAGNOSIS — G8929 Other chronic pain: Secondary | ICD-10-CM | POA: Insufficient documentation

## 2014-04-29 DIAGNOSIS — M25532 Pain in left wrist: Secondary | ICD-10-CM | POA: Insufficient documentation

## 2014-04-29 DIAGNOSIS — L511 Stevens-Johnson syndrome: Secondary | ICD-10-CM | POA: Insufficient documentation

## 2014-04-29 DIAGNOSIS — M5489 Other dorsalgia: Secondary | ICD-10-CM | POA: Insufficient documentation

## 2014-04-29 NOTE — Progress Notes (Signed)
Patient ID: Jade Cunningham, female   DOB: Jan 01, 1956, 58 y.o.   MRN: [id number]  CC: Medication management  HPI: Patient presents today with concerns over or generic blood pressure medication.  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  She reports that she has been taking brand Diovan since diagnosed with hypertension.  She reports that she was able to get the medication from a friend who had good insurance.  Today she reports that she cut [facility] which [facility] generic Diovan and believes that it is causing her symptoms of weakness, fatigue, and nausea.  She reports that she was afraid that the medication would call this a flare of Trudie BucklerSteven Johnson syndrome so she took charcoal to rid her body of the medication on Sunday.  She has not used the blood pressure medication since Sunday. Patient reports that she also received corticosteroid injections in her left hand from [facility] hand [facility].  She was told that she has a torn ligament in the left HEENT and neck could be the calls of the referred shoulder pain.  Patient is unsure if the corticosteroid injection could be culprit of recent symptoms of fatigue.       Allergies  Allergen Reactions  . Erythromycin Shortness Of Breath    Steven Johnson's Syndrome   . Ciprofloxacin Other (See Comments)    Viviann SpareSteven Johnson's Syndrome   . 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 Nyquil Multi-Symptom [Pseudoeph-Doxylamine-Dm-Apap] Other (See Comments)    Trudie BucklerSteven johnson syndrome    Past Medical History  Diagnosis Date  . Hypertension   . Thyroid disease   . Chronic back pain    Current Outpatient Prescriptions on File Prior to Visit  Medication Sig Dispense Refill  . acetaminophen (TYLENOL) 500 MG tablet Take 500 mg by mouth every 6 (six) hours as needed for pain.      . bacitracin 500 UNIT/GM  ointment Apply 1 application topically 2 (two) times daily.  15 g  1  . COD LIVER OIL PO Take 1 capsule by mouth daily.      . cyclobenzaprine (FLEXERIL) 10 MG tablet Take 1 tablet (10 mg total) by mouth 3 (three) times daily as needed for muscle spasms.  90 tablet  3  . diazepam (VALIUM) 5 MG tablet Take one tab one hour before MRI  2 tablet  0  . gabapentin (NEURONTIN) 100 MG capsule Take 1 capsule (100 mg total) by mouth at bedtime. May increase by one capsule every 3-4 days until taking 3 every evening  90 capsule  3  . valsartan-hydrochlorothiazide (DIOVAN-HCT) 80-12.5 MG per tablet Take 1 tablet by mouth daily.  90 tablet  1  . Vitamin D, Ergocalciferol, (DRISDOL) 50000 UNITS CAPS capsule Take 1 capsule (50,000 Units total) by mouth every 7 (seven) days.  8 capsule  0   No current facility-administered medications on file prior to visit.   Family History  Problem Relation Age of Onset  . Diabetes Mother   . Dementia Mother   . Thyroid disease Mother   . Cancer Father     bladder, kidney, prostate  . COPD Father   . COPD Brother    History   Social History  . Marital Status: Divorced    Spouse Name: N/A    Number of Children: N/A  . Years of Education: N/A  Occupational History  . Not on file.   Social History Main Topics  . Smoking status: Never Smoker   . Smokeless tobacco: Not on file  . Alcohol Use: No  . Drug Use: No  . Sexual Activity: Not Currently   Other Topics Concern  . Not on file   Social History Narrative  . No narrative on file    Review of Systems: As per history of present illness   Objective:   Filed Vitals:   04/29/14 1211  BP: 162/85  Pulse: 75  Temp: 97.7 F (36.5 C)  Resp: 16   Physical Exam  Constitutional: She is oriented to person, place, and time.  Neck: Normal range of motion.  Cardiovascular: Normal rate, regular rhythm and normal heart sounds.   Pulmonary/Chest: Effort normal and breath sounds normal. No respiratory  distress.  Musculoskeletal: She exhibits edema (Left wrist) and tenderness.  Decreased range of motion of left hand and wrist  Neurological: She is alert and oriented to person, place, and time.     Lab Results  Component Value Date   WBC 6.3 05/28/2013   HGB 12.9 05/28/2013   HCT 35.7* 05/28/2013   MCV 81.5 05/28/2013   PLT 307 05/28/2013   Lab Results  Component Value Date   CREATININE 0.68 05/28/2013   BUN 13 05/28/2013   NA 141 05/28/2013   K 3.5 05/28/2013   CL 104 05/28/2013   CO2 28 05/28/2013    No results found for this basename: HGBA1C   Lipid Panel  No results found for this basename: chol, trig, hdl, cholhdl, vldl, ldlcalc       Assessment and plan:   Jade Cunningham was seen today for follow-up, hypertension and wrist pain.  Diagnoses and associated orders for this visit:  Essential hypertension, benign We'll contact pharmacy to see if patient is able to receive brand Diovan. She will stay off generic over the weekend and try again on Monday to see if the generic diovan is what caused her symptoms.  If she continues to feel bad after repeat dose on Monday we will switch to another agent.    Left wrist pain Continue care with pain specialist  Return in about 3 months (around 07/30/2014) for Hypertension.    Holland Commons, NP-C [facility] [phone] 04/29/2014, 12:55 PM
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
## 2014-05-05 ENCOUNTER — Ambulatory Visit: Payer: No Typology Code available for payment source | Attending: Surgery | Admitting: Occupational Therapy

## 2014-05-05 DIAGNOSIS — M79643 Pain in unspecified hand: Secondary | ICD-10-CM | POA: Insufficient documentation

## 2014-05-05 DIAGNOSIS — M25649 Stiffness of unspecified hand, not elsewhere classified: Secondary | ICD-10-CM | POA: Insufficient documentation

## 2014-05-11 ENCOUNTER — Ambulatory Visit: Payer: No Typology Code available for payment source | Admitting: Occupational Therapy

## 2014-05-17 ENCOUNTER — Ambulatory Visit: Payer: No Typology Code available for payment source | Admitting: Occupational Therapy

## 2014-05-19 ENCOUNTER — Ambulatory Visit: Payer: No Typology Code available for payment source | Admitting: Occupational Therapy

## 2014-05-24 ENCOUNTER — Encounter: Payer: Self-pay | Admitting: Internal Medicine

## 2014-05-25 ENCOUNTER — Ambulatory Visit: Payer: No Typology Code available for payment source | Attending: Surgery | Admitting: Occupational Therapy

## 2014-05-25 ENCOUNTER — Encounter: Payer: Self-pay | Admitting: Occupational Therapy

## 2014-05-25 DIAGNOSIS — M6281 Muscle weakness (generalized): Secondary | ICD-10-CM

## 2014-05-25 DIAGNOSIS — M79643 Pain in unspecified hand: Secondary | ICD-10-CM | POA: Diagnosis present

## 2014-05-25 DIAGNOSIS — M25642 Stiffness of left hand, not elsewhere classified: Secondary | ICD-10-CM

## 2014-05-25 DIAGNOSIS — M79641 Pain in right hand: Secondary | ICD-10-CM

## 2014-05-25 DIAGNOSIS — M25649 Stiffness of unspecified hand, not elsewhere classified: Secondary | ICD-10-CM | POA: Diagnosis not present
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
## 2014-05-27 ENCOUNTER — Encounter: Payer: Self-pay | Admitting: Occupational Therapy

## 2014-05-27 ENCOUNTER — Ambulatory Visit: Payer: No Typology Code available for payment source | Admitting: Occupational Therapy

## 2014-05-27 DIAGNOSIS — M2560 Stiffness of unspecified joint, not elsewhere classified: Secondary | ICD-10-CM

## 2014-05-27 DIAGNOSIS — M79642 Pain in left hand: Secondary | ICD-10-CM

## 2014-05-27 DIAGNOSIS — M6281 Muscle weakness (generalized): Secondary | ICD-10-CM

## 2014-05-27 DIAGNOSIS — M79643 Pain in unspecified hand: Secondary | ICD-10-CM | POA: Diagnosis not present
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
## 2014-05-27 NOTE — Patient Instructions (Signed)
Pt was instructed to continue decreasing splint use L hand/wrist. Pt mainly uses splint when out in public and doing heavier activities at home (cleaning floor etc). She verbalized understanding of this.  Pt was educated in Performance of AROM ex's while in fluidotherapy ( for RD, UD, gentle wrist flexion/extension, isolated/blocked IP flexion and MP flexion, thumb opposition and composite flexion exercises.   Pt performed wrist ranger exercises and putty exercises for grip, pinches x10 Reps each.  Pt reports that she still has not received documentation for continued financial assistance, but has called and was told that she should hear back next week. Pt educated to verify before next appointment and call to cancel if needed due to financial concerns.

## 2014-06-01 ENCOUNTER — Telehealth: Payer: Self-pay | Admitting: Occupational Therapy

## 2014-06-01 ENCOUNTER — Ambulatory Visit: Payer: No Typology Code available for payment source | Admitting: Occupational Therapy

## 2014-06-01 NOTE — Telephone Encounter (Signed)
Called pt to see if [facility] was [facility].  Pt reports that she is still providing documentation and that it has not been extended yet.  Pt cancelled appt today due to financial concerns.  Pt to call and cancel next appt if approval is not received.

## 2014-06-02 ENCOUNTER — Ambulatory Visit: Payer: No Typology Code available for payment source | Admitting: Occupational Therapy

## 2014-06-07 ENCOUNTER — Ambulatory Visit: Payer: No Typology Code available for payment source | Admitting: Occupational Therapy

## 2014-06-10 ENCOUNTER — Ambulatory Visit: Payer: No Typology Code available for payment source | Admitting: Occupational Therapy

## 2014-06-10 ENCOUNTER — Encounter: Payer: Self-pay | Admitting: Occupational Therapy

## 2014-06-10 DIAGNOSIS — M79642 Pain in left hand: Secondary | ICD-10-CM

## 2014-06-10 DIAGNOSIS — M79643 Pain in unspecified hand: Secondary | ICD-10-CM | POA: Diagnosis not present

## 2014-06-10 DIAGNOSIS — M25642 Stiffness of left hand, not elsewhere classified: Secondary | ICD-10-CM

## 2014-06-10 DIAGNOSIS — M6281 Muscle weakness (generalized): Secondary | ICD-10-CM

## 2014-06-10 DIAGNOSIS — M79641 Pain in right hand: Secondary | ICD-10-CM
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
## 2014-06-10 NOTE — Patient Instructions (Signed)
  Flexor Tendon Gliding (Active Hook Fist)   With fingers and knuckles straight, bend middle and tip joints. Do not bend large knuckles. Repeat 10 times. Do 3 sessions per day.   Flexor Tendon Gliding (Active Full Fist)   Straighten all fingers, then make a fist, bending all joints. Repeat 10 times. Do 3sessions per day.   Flexor Tendon Gliding (Active Straight Fist)   Start with fingers straight. Bend knuckles and middle joints. Keep fingertip joints straight to touch base of palm. Repeat 10 times. Do 3 sessions per day.   MP Flexion (Active Isolated)   Bend ALL fingers at large knuckle, keeping other fingers straight. Do not bend tips. Repeat 10 times. Do 3 sessions per day.      

## 2014-06-22 ENCOUNTER — Ambulatory Visit: Payer: No Typology Code available for payment source | Attending: Surgery | Admitting: Occupational Therapy

## 2014-06-22 ENCOUNTER — Encounter: Payer: Self-pay | Admitting: Occupational Therapy

## 2014-06-22 DIAGNOSIS — M79643 Pain in unspecified hand: Secondary | ICD-10-CM | POA: Insufficient documentation

## 2014-06-22 DIAGNOSIS — M79642 Pain in left hand: Secondary | ICD-10-CM

## 2014-06-22 DIAGNOSIS — M25642 Stiffness of left hand, not elsewhere classified: Secondary | ICD-10-CM

## 2014-06-22 DIAGNOSIS — M25649 Stiffness of unspecified hand, not elsewhere classified: Secondary | ICD-10-CM | POA: Insufficient documentation

## 2014-06-22 DIAGNOSIS — M6281 Muscle weakness (generalized): Secondary | ICD-10-CM
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
## 2014-06-24 ENCOUNTER — Ambulatory Visit: Payer: No Typology Code available for payment source | Admitting: Occupational Therapy

## 2014-06-24 ENCOUNTER — Encounter: Payer: Self-pay | Admitting: Occupational Therapy

## 2014-06-24 DIAGNOSIS — M79642 Pain in left hand: Secondary | ICD-10-CM

## 2014-06-24 DIAGNOSIS — M6281 Muscle weakness (generalized): Secondary | ICD-10-CM

## 2014-06-24 DIAGNOSIS — M25642 Stiffness of left hand, not elsewhere classified: Secondary | ICD-10-CM
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
## 2014-06-28 ENCOUNTER — Encounter: Payer: Self-pay | Admitting: Occupational Therapy

## 2014-06-28 ENCOUNTER — Ambulatory Visit: Payer: No Typology Code available for payment source | Admitting: Occupational Therapy

## 2014-06-28 DIAGNOSIS — M79642 Pain in left hand: Secondary | ICD-10-CM

## 2014-06-28 DIAGNOSIS — M79641 Pain in right hand: Secondary | ICD-10-CM

## 2014-06-28 DIAGNOSIS — M6281 Muscle weakness (generalized): Secondary | ICD-10-CM

## 2014-06-28 DIAGNOSIS — M25642 Stiffness of left hand, not elsewhere classified: Secondary | ICD-10-CM
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
## 2014-06-28 NOTE — Patient Instructions (Signed)
Wrist Flexion: Resisted   With right palm up, 1 pound weight in hand, bend wrist up. Return slowly. Repeat 15 times per set.  Do 2 sessions per day.  Wrist Extension: Resisted   With right palm down, 1 pound weight in hand, bend wrist up. Return slowly. Repeat 10 times per set. Do 2 sessions per day.  

## 2014-07-01 ENCOUNTER — Ambulatory Visit: Payer: No Typology Code available for payment source | Admitting: Occupational Therapy

## 2014-07-01 ENCOUNTER — Encounter: Payer: Self-pay | Admitting: Occupational Therapy

## 2014-07-01 DIAGNOSIS — M79642 Pain in left hand: Secondary | ICD-10-CM

## 2014-07-01 DIAGNOSIS — M6281 Muscle weakness (generalized): Secondary | ICD-10-CM

## 2014-07-01 DIAGNOSIS — M79641 Pain in right hand: Secondary | ICD-10-CM

## 2014-07-01 DIAGNOSIS — M25642 Stiffness of left hand, not elsewhere classified: Secondary | ICD-10-CM
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
## 2014-08-04 ENCOUNTER — Encounter: Payer: Self-pay | Admitting: Internal Medicine

## 2014-08-04 ENCOUNTER — Ambulatory Visit: Payer: No Typology Code available for payment source | Attending: Internal Medicine | Admitting: Internal Medicine

## 2014-08-04 VITALS — BP 138/88 | HR 78 | Temp 98.2°F | Resp 16 | Ht 62.0 in | Wt 168.0 lb

## 2014-08-04 DIAGNOSIS — M79645 Pain in left finger(s): Secondary | ICD-10-CM | POA: Insufficient documentation

## 2014-08-04 DIAGNOSIS — I10 Essential (primary) hypertension: Secondary | ICD-10-CM | POA: Insufficient documentation

## 2014-08-04 DIAGNOSIS — N644 Mastodynia: Secondary | ICD-10-CM | POA: Insufficient documentation

## 2014-08-04 NOTE — Progress Notes (Signed)
Pt is here following up on her HTN and her chronic pain in her left thumb and her left breast.

## 2014-08-04 NOTE — Progress Notes (Signed)
Patient ID: Jade Cunningham, female   DOB: Sep 15, 1955, 59 y.o.   MRN: [id number]  CC: HTN, thumb pain  HPI: Jade Cunningham is a 59 y.o. female here today for a follow up visit.  Patient has past medical history of hypertension and chronic thumb pain.  Patient reports that she has been exercising more and eating healthier. She states that she has been able to get the brand Diovan from a friend and has been keeping the generic version in case she runs out.  She reports that if she takes a half of the generic tablet then she does not have symptoms of itch and skin discomfort. She reports that she has continued to get injections from Orthopedics but states that she has continued pain and wearing her brace helps.    Patient has No headache, No chest pain, No abdominal pain - No Nausea, No new weakness tingling or numbness, No Cough - SOB.  Allergies  Allergen Reactions  . Erythromycin Shortness Of Breath    Steven Johnson's Syndrome   . Ciprofloxacin Other (See Comments)    Viviann Spare Johnson's Syndrome   . 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 Nyquil Multi-Symptom [Pseudoeph-Doxylamine-Dm-Apap] Other (See Comments)    Trudie Buckler syndrome    Past Medical History  Diagnosis Date  . Hypertension   . Thyroid disease   . Chronic back pain    Current Outpatient Prescriptions on File Prior to Visit  Medication Sig Dispense Refill  . bacitracin 500 UNIT/GM ointment Apply 1 application topically 2 (two) times daily. 15 g 1  . COD LIVER OIL PO Take 1 capsule by mouth daily.    . cyclobenzaprine (FLEXERIL) 10 MG tablet Take 1 tablet (10 mg total) by mouth 3 (three) times daily as needed for muscle spasms. 90 tablet 3  . Naproxen Sodium (ALEVE PO) Take by mouth.    . valsartan-hydrochlorothiazide (DIOVAN-HCT) 80-12.5 MG per tablet Take 1 tablet by mouth daily. 90 tablet 1  . acetaminophen (TYLENOL) 500 MG tablet Take 500 mg by mouth every 6 (six) hours as  needed for pain.    . diazepam (VALIUM) 5 MG tablet Take one tab one hour before MRI (Patient not taking: Reported on 08/04/2014) 2 tablet 0  . gabapentin (NEURONTIN) 100 MG capsule Take 1 capsule (100 mg total) by mouth at bedtime. May increase by one capsule every 3-4 days until taking 3 every evening (Patient not taking: Reported on 08/04/2014) 90 capsule 3  . Vitamin D, Ergocalciferol, (DRISDOL) 50000 UNITS CAPS capsule Take 1 capsule (50,000 Units total) by mouth every 7 (seven) days. (Patient not taking: Reported on 08/04/2014) 8 capsule 0   No current facility-administered medications on file prior to visit.   Family History  Problem Relation Age of Onset  . Diabetes Mother   . Dementia Mother   . Thyroid disease Mother   . Cancer Father     bladder, kidney, prostate  . COPD Father   . COPD Brother    History   Social History  . Marital Status: Divorced    Spouse Name: N/A    Number of Children: N/A  . Years of Education: N/A   Occupational History  . Not on file.   Social History Main Topics  . Smoking status: Never Smoker   . Smokeless tobacco: Not on file  . Alcohol Use: No  . Drug Use: No  . Sexual Activity: Not Currently   Other  Topics Concern  . Not on file   Social History Narrative    Review of Systems: See HPI   Objective:   Filed Vitals:   08/04/14 1707  BP: 138/88  Pulse: 78  Temp: 98.2 F (36.8 C)  Resp: 16    Physical Exam  Constitutional: She is oriented to person, place, and time.  Cardiovascular: Normal rate, regular rhythm and normal heart sounds.   Pulmonary/Chest: Effort normal and breath sounds normal. Right breast exhibits no mass. Left breast exhibits no mass.  Genitourinary: No breast tenderness or discharge.  Musculoskeletal: She exhibits tenderness (Left thumb). She exhibits no edema.  Neurological: She is alert and oriented to person, place, and time.  Skin: Skin is warm and dry.     Lab Results  Component Value Date    WBC 6.3 05/28/2013   HGB 12.9 05/28/2013   HCT 35.7* 05/28/2013   MCV 81.5 05/28/2013   PLT 307 05/28/2013   Lab Results  Component Value Date   CREATININE 0.68 05/28/2013   BUN 13 05/28/2013   NA 141 05/28/2013   K 3.5 05/28/2013   CL 104 05/28/2013   CO2 28 05/28/2013    No results found for: HGBA1C Lipid Panel  No results found for: CHOL, TRIG, HDL, CHOLHDL, VLDL, LDLCALC     Assessment and plan:   [name] was seen today for follow-up.  Diagnoses and associated orders for this visit:  Essential hypertension, benign Patient blood pressure is stable and may continue on current medication.  Education on diet, exercise, and modifiable risk factors discussed. Will obtain appropriate labs as needed. Will follow up in 3-6 months.   Breast pain, left - MM Digital Diagnostic Bilat; Future  Return in about 6 months (around 02/02/2015) for Hypertension.       Holland CommonsKECK, VALERIE, NP-C [facility] [phone] 08/04/2014, 5:38 PM

## 2014-08-04 NOTE — Patient Instructions (Signed)
DASH Eating Plan °DASH stands for "Dietary Approaches to Stop Hypertension." The DASH eating plan is a healthy eating plan that has been shown to reduce high blood pressure (hypertension). Additional health benefits may include reducing the risk of type 2 diabetes mellitus, heart disease, and stroke. The DASH eating plan may also help with weight loss. °WHAT DO I NEED TO KNOW ABOUT THE DASH EATING PLAN? °For the DASH eating plan, you will follow these general guidelines: °· Choose foods with a percent daily value for sodium of less than 5% (as listed on the food label). °· Use salt-free seasonings or herbs instead of table salt or sea salt. °· Check with your health care provider or pharmacist before using salt substitutes. °· Eat lower-sodium products, often labeled as "lower sodium" or "no salt added." °· Eat fresh foods. °· Eat more vegetables, fruits, and low-fat dairy products. °· Choose whole grains. Look for the word "whole" as the first word in the ingredient list. °· Choose fish and skinless chicken or turkey more often than red meat. Limit fish, poultry, and meat to 6 oz (170 g) each day. °· Limit sweets, desserts, sugars, and sugary drinks. °· Choose heart-healthy fats. °· Limit cheese to 1 oz (28 g) per day. °· Eat more home-cooked food and less restaurant, buffet, and fast food. °· Limit fried foods. °· Cook foods using methods other than frying. °· Limit canned vegetables. If you do use them, rinse them well to decrease the sodium. °· When eating at a restaurant, ask that your food be prepared with less salt, or no salt if possible. °WHAT FOODS CAN I EAT? °Seek help from a dietitian for individual calorie needs. °Grains °Whole grain or whole wheat bread. Brown rice. Whole grain or whole wheat pasta. Quinoa, bulgur, and whole grain cereals. Low-sodium cereals. Corn or whole wheat flour tortillas. Whole grain cornbread. Whole grain crackers. Low-sodium crackers. °Vegetables °Fresh or frozen vegetables  (raw, steamed, roasted, or grilled). Low-sodium or reduced-sodium tomato and vegetable juices. Low-sodium or reduced-sodium tomato sauce and paste. Low-sodium or reduced-sodium canned vegetables.  °Fruits °All fresh, canned (in natural juice), or frozen fruits. °Meat and Other Protein Products °Ground beef (85% or leaner), grass-fed beef, or beef trimmed of fat. Skinless chicken or turkey. Ground chicken or turkey. Pork trimmed of fat. All fish and seafood. Eggs. Dried beans, peas, or lentils. Unsalted nuts and seeds. Unsalted canned beans. °Dairy °Low-fat dairy products, such as skim or 1% milk, 2% or reduced-fat cheeses, low-fat ricotta or cottage cheese, or plain low-fat yogurt. Low-sodium or reduced-sodium cheeses. °Fats and Oils °Tub margarines without trans fats. Light or reduced-fat mayonnaise and salad dressings (reduced sodium). Avocado. Safflower, olive, or canola oils. Natural peanut or almond butter. °Other °Unsalted popcorn and pretzels. °The items listed above may not be a complete list of recommended foods or beverages. Contact your dietitian for more options. °WHAT FOODS ARE NOT RECOMMENDED? °Grains °White bread. White pasta. White rice. Refined cornbread. Bagels and croissants. Crackers that contain trans fat. °Vegetables °Creamed or fried vegetables. Vegetables in a cheese sauce. Regular canned vegetables. Regular canned tomato sauce and paste. Regular tomato and vegetable juices. °Fruits °Dried fruits. Canned fruit in light or heavy syrup. Fruit juice. °Meat and Other Protein Products °Fatty cuts of meat. Ribs, chicken wings, bacon, sausage, bologna, salami, chitterlings, fatback, hot dogs, bratwurst, and packaged luncheon meats. Salted nuts and seeds. Canned beans with salt. °Dairy °Whole or 2% milk, cream, half-and-half, and cream cheese. Whole-fat or sweetened yogurt. Full-fat   cheeses or blue cheese. Nondairy creamers and whipped toppings. Processed cheese, cheese spreads, or cheese  curds. °Condiments °Onion and garlic salt, seasoned salt, table salt, and sea salt. Canned and packaged gravies. Worcestershire sauce. Tartar sauce. Barbecue sauce. Teriyaki sauce. Soy sauce, including reduced sodium. Steak sauce. Fish sauce. Oyster sauce. Cocktail sauce. Horseradish. Ketchup and mustard. Meat flavorings and tenderizers. Bouillon cubes. Hot sauce. Tabasco sauce. Marinades. Taco seasonings. Relishes. °Fats and Oils °Butter, stick margarine, lard, shortening, ghee, and bacon fat. Coconut, palm kernel, or palm oils. Regular salad dressings. °Other °Pickles and olives. Salted popcorn and pretzels. °The items listed above may not be a complete list of foods and beverages to avoid. Contact your dietitian for more information. °WHERE CAN I FIND MORE INFORMATION? °National Heart, Lung, and Blood Institute: www.nhlbi.nih.gov/health/health-topics/topics/dash/ °Document Released: 06/28/2011 Document Revised: 11/23/2013 Document Reviewed: 05/13/2013 °ExitCare® Patient Information ©2015 [employer]. This information is not intended to replace advice given to you by your health care provider. Make sure you discuss any questions you have with your health care provider. ° °

## 2014-08-05 ENCOUNTER — Other Ambulatory Visit: Payer: Self-pay | Admitting: Internal Medicine

## 2014-08-05 DIAGNOSIS — N644 Mastodynia: Secondary | ICD-10-CM

## 2014-08-17 ENCOUNTER — Ambulatory Visit
Admission: RE | Admit: 2014-08-17 | Discharge: 2014-08-17 | Disposition: A | Discharge status: Home / Self Care | Payer: No Typology Code available for payment source | Source: Ambulatory Visit | Attending: Internal Medicine | Admitting: Internal Medicine

## 2014-08-17 DIAGNOSIS — N644 Mastodynia: Secondary | ICD-10-CM

## 2014-08-19 DIAGNOSIS — M25349 Other instability, unspecified hand: Secondary | ICD-10-CM | POA: Insufficient documentation

## 2014-08-19 DIAGNOSIS — S63599A Other specified sprain of unspecified wrist, initial encounter: Secondary | ICD-10-CM | POA: Insufficient documentation

## 2014-08-25 ENCOUNTER — Telehealth: Payer: Self-pay | Admitting: *Deleted

## 2014-08-25 NOTE — Telephone Encounter (Signed)
-----   Message from Ambrose FinlandValerie A Keck, NP sent at 08/24/2014  9:09 PM EST ----- Will need diagnostic ultrasound of the left breast in 6 months for reevaluation.

## 2014-08-25 NOTE — Telephone Encounter (Signed)
Pt aware, will call back in [place] for appointment

## 2014-12-13 ENCOUNTER — Encounter: Payer: Self-pay | Admitting: Internal Medicine

## 2014-12-13 NOTE — Telephone Encounter (Signed)
Error

## 2014-12-24 ENCOUNTER — Other Ambulatory Visit: Payer: Self-pay | Admitting: Internal Medicine

## 2014-12-24 DIAGNOSIS — N6489 Other specified disorders of breast: Secondary | ICD-10-CM
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
## 2015-02-18 ENCOUNTER — Ambulatory Visit
Admission: RE | Admit: 2015-02-18 | Discharge: 2015-02-18 | Disposition: A | Discharge status: Home / Self Care | Payer: Self-pay | Source: Ambulatory Visit | Attending: Internal Medicine | Admitting: Internal Medicine

## 2015-02-18 DIAGNOSIS — N6489 Other specified disorders of breast: Secondary | ICD-10-CM

## 2015-02-23 ENCOUNTER — Telehealth: Payer: Self-pay | Admitting: Internal Medicine

## 2015-02-23 NOTE — Telephone Encounter (Signed)
Patient called to inquire if her results and medical records have been reviewed by her PCP. Please f/u

## 2015-03-08 ENCOUNTER — Encounter: Payer: Self-pay | Admitting: Internal Medicine

## 2015-03-08 ENCOUNTER — Ambulatory Visit: Payer: Self-pay | Attending: Internal Medicine | Admitting: Internal Medicine

## 2015-03-08 VITALS — BP 136/82 | HR 88 | Temp 98.0°F | Resp 18 | Ht 62.5 in | Wt 166.0 lb

## 2015-03-08 DIAGNOSIS — M545 Low back pain, unspecified: Secondary | ICD-10-CM

## 2015-03-08 DIAGNOSIS — G8929 Other chronic pain: Secondary | ICD-10-CM | POA: Insufficient documentation

## 2015-03-08 DIAGNOSIS — M79642 Pain in left hand: Secondary | ICD-10-CM | POA: Insufficient documentation

## 2015-03-08 DIAGNOSIS — I10 Essential (primary) hypertension: Secondary | ICD-10-CM | POA: Insufficient documentation

## 2015-03-08 DIAGNOSIS — Z79899 Other long term (current) drug therapy: Secondary | ICD-10-CM | POA: Insufficient documentation

## 2015-03-08 DIAGNOSIS — E079 Disorder of thyroid, unspecified: Secondary | ICD-10-CM | POA: Insufficient documentation

## 2015-03-08 MED ORDER — CYCLOBENZAPRINE HCL 10 MG PO TABS: 10.0000 mg | ORAL_TABLET | Freq: Three times a day (TID) | ORAL | Status: AC | PRN

## 2015-03-08 MED ORDER — VALSARTAN-HYDROCHLOROTHIAZIDE 80-12.5 MG PO TABS: 1.0000 | ORAL_TABLET | Freq: Every day | ORAL | Status: DC

## 2015-03-08 MED ORDER — BACITRACIN 500 UNIT/GM EX OINT: 1.0000 "application " | TOPICAL_OINTMENT | Freq: Two times a day (BID) | CUTANEOUS | Status: AC
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
## 2015-03-10 ENCOUNTER — Ambulatory Visit: Payer: Self-pay | Attending: Internal Medicine

## 2015-03-10 DIAGNOSIS — I10 Essential (primary) hypertension: Secondary | ICD-10-CM

## 2015-03-10 LAB — COMPLETE METABOLIC PANEL WITH GFR
ALT: 12 U/L (ref 6–29)
AST: 17 U/L (ref 10–35)
Albumin: 4 g/dL (ref 3.6–5.1)
Alkaline Phosphatase: 77 U/L (ref 33–130)
BILIRUBIN TOTAL: 0.4 mg/dL (ref 0.2–1.2)
BUN: 11 mg/dL (ref 7–25)
CO2: 27 mmol/L (ref 20–31)
Calcium: 8.8 mg/dL (ref 8.6–10.4)
Chloride: 103 mmol/L (ref 98–110)
Creat: 0.58 mg/dL (ref 0.50–0.99)
GFR, Est African American: >89 mL/min (ref >=60)
GLUCOSE: 98 mg/dL (ref 65–99)
Potassium: 4.2 mmol/L (ref 3.5–5.3)
SODIUM: 140 mmol/L (ref 135–146)
TOTAL PROTEIN: 7.4 g/dL (ref 6.1–8.1)

## 2015-03-10 LAB — LIPID PANEL
CHOL/HDL RATIO: 3.2 ratio (ref <=5.0)
Cholesterol: 180 mg/dL (ref 125–200)
HDL: 56 mg/dL (ref >=46)
LDL CALC: 113 mg/dL (ref <130)
Triglycerides: 57 mg/dL (ref <150)
VLDL: 11 mg/dL (ref <30)

## 2015-03-10 LAB — CBC
HCT: 36.6 % (ref 36.0–46.0)
Hemoglobin: 13.4 g/dL (ref 12.0–15.0)
MCH: 30.3 pg (ref 26.0–34.0)
MCHC: 36.6 g/dL — ABNORMAL HIGH (ref 30.0–36.0)
MCV: 82.8 fL (ref 78.0–100.0)
MPV: 9.3 fL (ref 8.6–12.4)
PLATELETS: 292 10*3/uL (ref 150–400)
RBC: 4.42 MIL/uL (ref 3.87–5.11)
RDW: 13.8 % (ref 11.5–15.5)
WBC: 5.7 10*3/uL (ref 4.0–10.5)

## 2015-03-16 ENCOUNTER — Telehealth: Payer: Self-pay | Admitting: General Practice

## 2015-03-16 NOTE — Telephone Encounter (Signed)
Patient presents to clinic to speak to a nurse in regards to some ongoing symptoms that she is experiencing with nausea. Patient states that in the past, she was prescribed a nausea medication that helped her a great deal... And would like to be prescribed this again Please assist

## 2015-03-22 NOTE — Telephone Encounter (Signed)
Error

## 2015-03-24 ENCOUNTER — Telehealth: Payer: Self-pay

## 2015-03-24 NOTE — Telephone Encounter (Signed)
Nurse called patient, patient verified date of birth. Patient aware of bad cholesterol elevated. Patient aware to limit breads, pasta, rice, butters and fried foods, because they can raise cholesterol levels. Patient aware of high cholesterol placing her at risk for stroke and heart disease.  Patient voices understanding and has no further questions at this time.

## 2015-03-24 NOTE — Telephone Encounter (Signed)
-----   Message from Jade Finland, NP sent at 03/13/2015  7:11 PM EDT ----- Bad cholesterol elevated. Please go over things that increase cholesterol levels such as breads pasta, rice, butters, fried foods, etc. Please explain that high cholesterol places her at risk for stroke and heart disease

## 2015-06-27 ENCOUNTER — Telehealth: Payer: Self-pay

## 2015-06-27 NOTE — Telephone Encounter (Signed)
Attempted to contact patient  Patient not available Unable to leave message

## 2015-10-14 ENCOUNTER — Emergency Department (INDEPENDENT_AMBULATORY_CARE_PROVIDER_SITE_OTHER)
Admission: EM | Admit: 2015-10-14 | Discharge: 2015-10-14 | Disposition: A | Discharge status: Home / Self Care | Payer: Self-pay | Source: Home / Self Care | Attending: Emergency Medicine | Admitting: Emergency Medicine

## 2015-10-14 ENCOUNTER — Encounter (HOSPITAL_COMMUNITY): Payer: Self-pay | Admitting: Emergency Medicine

## 2015-10-14 DIAGNOSIS — H109 Unspecified conjunctivitis: Secondary | ICD-10-CM

## 2015-10-14 MED ORDER — TETRACAINE HCL 0.5 % OP SOLN
OPHTHALMIC | Status: AC
  Filled 2015-10-14: qty 2

## 2015-10-14 MED ORDER — POLYMYXIN B-TRIMETHOPRIM 10000-0.1 UNIT/ML-% OP SOLN: 1.0000 [drp] | Freq: Four times a day (QID) | OPHTHALMIC | Status: AC
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
## 2015-10-14 NOTE — ED Notes (Signed)
Here with possible bacterial conjunctivitis in both eyes Redness, swelling, burning and today increased blurred vision Tried natural home remedies

## 2015-10-14 NOTE — Discharge Instructions (Signed)
You likely have pinkeye. There was no sign of scratches to your eye or glaucoma. Use the Polytrim drops 4 times a day. Follow-up with Dr. Edrick Ohing on Monday at noon.

## 2015-11-02 ENCOUNTER — Other Ambulatory Visit: Payer: Self-pay | Admitting: Internal Medicine

## 2015-11-08 ENCOUNTER — Ambulatory Visit: Payer: Self-pay | Attending: Internal Medicine | Admitting: Internal Medicine

## 2015-11-08 ENCOUNTER — Encounter: Payer: Self-pay | Admitting: Internal Medicine

## 2015-11-08 VITALS — BP 154/88 | HR 81 | Temp 98.1°F | Wt 172.4 lb

## 2015-11-08 DIAGNOSIS — Z760 Encounter for issue of repeat prescription: Secondary | ICD-10-CM | POA: Insufficient documentation

## 2015-11-08 DIAGNOSIS — J302 Other seasonal allergic rhinitis: Secondary | ICD-10-CM | POA: Insufficient documentation

## 2015-11-08 DIAGNOSIS — R05 Cough: Secondary | ICD-10-CM | POA: Insufficient documentation

## 2015-11-08 DIAGNOSIS — I10 Essential (primary) hypertension: Secondary | ICD-10-CM

## 2015-11-08 DIAGNOSIS — T7840XA Allergy, unspecified, initial encounter: Secondary | ICD-10-CM

## 2015-11-08 DIAGNOSIS — Z79899 Other long term (current) drug therapy: Secondary | ICD-10-CM | POA: Insufficient documentation

## 2015-11-08 DIAGNOSIS — T50995A Adverse effect of other drugs, medicaments and biological substances, initial encounter: Secondary | ICD-10-CM

## 2015-11-08 DIAGNOSIS — Z888 Allergy status to other drugs, medicaments and biological substances status: Secondary | ICD-10-CM | POA: Insufficient documentation

## 2015-11-08 DIAGNOSIS — H538 Other visual disturbances: Secondary | ICD-10-CM | POA: Insufficient documentation

## 2015-11-08 LAB — CMP AND LIVER
ALBUMIN: 4.4 g/dL (ref 3.6–5.1)
ALT: 19 U/L (ref 6–29)
AST: 23 U/L (ref 10–35)
Alkaline Phosphatase: 81 U/L (ref 33–130)
BILIRUBIN DIRECT: 0.1 mg/dL (ref <=0.2)
BILIRUBIN TOTAL: 0.3 mg/dL (ref 0.2–1.2)
BUN: 8 mg/dL (ref 7–25)
CALCIUM: 9.4 mg/dL (ref 8.6–10.4)
CO2: 24 mmol/L (ref 20–31)
CREATININE: 0.69 mg/dL (ref 0.50–0.99)
Chloride: 105 mmol/L (ref 98–110)
Glucose, Bld: 93 mg/dL (ref 65–99)
Indirect Bilirubin: 0.2 mg/dL (ref 0.2–1.2)
Potassium: 3.9 mmol/L (ref 3.5–5.3)
Sodium: 140 mmol/L (ref 135–146)
TOTAL PROTEIN: 8 g/dL (ref 6.1–8.1)

## 2015-11-08 LAB — CBC WITH DIFFERENTIAL/PLATELET
BASOS PCT: 1 %
Basophils Absolute: 53 cells/uL (ref 0–200)
EOS ABS: 212 {cells}/uL (ref 15–500)
Eosinophils Relative: 4 %
HEMATOCRIT: 39.3 % (ref 35.0–45.0)
HEMOGLOBIN: 13.4 g/dL (ref 11.7–15.5)
LYMPHS ABS: 1378 {cells}/uL (ref 850–3900)
Lymphocytes Relative: 26 %
MCH: 28.5 pg (ref 27.0–33.0)
MCHC: 34.1 g/dL (ref 32.0–36.0)
MCV: 83.6 fL (ref 80.0–100.0)
MONO ABS: 371 {cells}/uL (ref 200–950)
MPV: 9.1 fL (ref 7.5–12.5)
Monocytes Relative: 7 %
Neutro Abs: 3286 cells/uL (ref 1500–7800)
Neutrophils Relative %: 62 %
Platelets: 352 10*3/uL (ref 140–400)
RBC: 4.7 MIL/uL (ref 3.80–5.10)
RDW: 14 % (ref 11.0–15.0)
WBC: 5.3 10*3/uL (ref 3.8–10.8)

## 2015-11-08 LAB — TSH: TSH: 1.28 m[IU]/L

## 2015-11-08 MED ORDER — VALSARTAN-HYDROCHLOROTHIAZIDE 80-12.5 MG PO TABS: 1.0000 | ORAL_TABLET | Freq: Every day | ORAL | Status: DC

## 2015-11-08 MED FILL — VALSARTAN-HCTZ 80-12.5 MG T: 80-12.5 | 30 days supply | Qty: 30 | Fill #0
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
## 2015-11-08 NOTE — Patient Instructions (Addendum)
appt w/ Misty StanleyStacey pharm 2 wks for bp chk.  Hypertension Hypertension is another name for high blood pressure. High blood pressure forces your heart to work harder to pump blood. A blood pressure reading has two numbers, which includes a higher number over a lower number (example: 110/72). HOME CARE   Have your blood pressure rechecked by your doctor.  Only take medicine as told by your doctor. Follow the directions carefully. The medicine does not work as well if you skip doses. Skipping doses also puts you at risk for problems.  Do not smoke.  Monitor your blood pressure at home as told by your doctor. GET HELP IF:  You think you are having a reaction to the medicine you are taking.  You have repeat headaches or feel dizzy.  You have puffiness (swelling) in your ankles.  You have trouble with your vision. GET HELP RIGHT AWAY IF:   You get a very bad headache and are confused.  You feel weak, numb, or faint.  You get chest or belly (abdominal) pain.  You throw up (vomit).  You cannot breathe very well. MAKE SURE YOU:   Understand these instructions.  Will watch your condition.  Will get help right away if you are not doing well or get worse.   This information is not intended to replace advice given to you by your health care provider. Make sure you discuss any questions you have with your health care provider.   Document Released: 12/26/2007 Document Revised: 07/14/2013 Document Reviewed: 05/01/2013 Elsevier Interactive Patient Education 2016 Elsevier Inc.   - DASH Eating Plan DASH stands for "Dietary Approaches to Stop Hypertension." The DASH eating plan is a healthy eating plan that has been shown to reduce high blood pressure (hypertension). Additional health benefits may include reducing the risk of type 2 diabetes mellitus, heart disease, and stroke. The DASH eating plan may also help with weight loss. WHAT DO I NEED TO KNOW ABOUT THE DASH EATING PLAN? For the  DASH eating plan, you will follow these general guidelines:  Choose foods with a percent daily value for sodium of less than 5% (as listed on the food label).  Use salt-free seasonings or herbs instead of table salt or sea salt.  Check with your health care provider or pharmacist before using salt substitutes.  Eat lower-sodium products, often labeled as "lower sodium" or "no salt added."  Eat fresh foods.  Eat more vegetables, fruits, and low-fat dairy products.  Choose whole grains. Look for the word "whole" as the first word in the ingredient list.  Choose fish and skinless chicken or [place] more often than red meat. Limit fish, poultry, and meat to 6 oz (170 g) each day.  Limit sweets, desserts, sugars, and sugary drinks.  Choose heart-healthy fats.  Limit cheese to 1 oz (28 g) per day.  Eat more home-cooked food and less restaurant, buffet, and fast food.  Limit fried foods.  Cook foods using methods other than frying.  Limit canned vegetables. If you do use them, rinse them well to decrease the sodium.  When eating at a restaurant, ask that your food be prepared with less salt, or no salt if possible. WHAT FOODS CAN I EAT? Seek help from a dietitian for individual calorie needs. Grains Whole grain or whole wheat bread. Brown rice. Whole grain or whole wheat pasta. Quinoa, bulgur, and whole grain cereals. Low-sodium cereals. Corn or whole wheat flour tortillas. Whole grain cornbread. Whole grain crackers. Low-sodium crackers. Vegetables  Fresh or frozen vegetables (raw, steamed, roasted, or grilled). Low-sodium or reduced-sodium tomato and vegetable juices. Low-sodium or reduced-sodium tomato sauce and paste. Low-sodium or reduced-sodium canned vegetables.  Fruits All fresh, canned (in natural juice), or frozen fruits. Meat and Other Protein Products Ground beef (85% or leaner), grass-fed beef, or beef trimmed of fat. Skinless chicken or [place]. Ground chicken or [place].  Pork trimmed of fat. All fish and seafood. Eggs. Dried beans, peas, or lentils. Unsalted nuts and seeds. Unsalted canned beans. Dairy Low-fat dairy products, such as skim or 1% milk, 2% or reduced-fat cheeses, low-fat ricotta or cottage cheese, or plain low-fat yogurt. Low-sodium or reduced-sodium cheeses. Fats and Oils Tub margarines without trans fats. Light or reduced-fat mayonnaise and salad dressings (reduced sodium). Avocado. Safflower, olive, or canola oils. Natural peanut or almond butter. Other Unsalted popcorn and pretzels. The items listed above may not be a complete list of recommended foods or beverages. Contact your dietitian for more options. WHAT FOODS ARE NOT RECOMMENDED? Grains White bread. White pasta. White rice. Refined cornbread. Bagels and croissants. Crackers that contain trans fat. Vegetables Creamed or fried vegetables. Vegetables in a cheese sauce. Regular canned vegetables. Regular canned tomato sauce and paste. Regular tomato and vegetable juices. Fruits Dried fruits. Canned fruit in light or heavy syrup. Fruit juice. Meat and Other Protein Products Fatty cuts of meat. Ribs, chicken wings, bacon, sausage, bologna, salami, chitterlings, fatback, hot dogs, bratwurst, and packaged luncheon meats. Salted nuts and seeds. Canned beans with salt. Dairy Whole or 2% milk, cream, half-and-half, and cream cheese. Whole-fat or sweetened yogurt. Full-fat cheeses or blue cheese. Nondairy creamers and whipped toppings. Processed cheese, cheese spreads, or cheese curds. Condiments Onion and garlic salt, seasoned salt, table salt, and sea salt. Canned and packaged gravies. Worcestershire sauce. Tartar sauce. Barbecue sauce. Teriyaki sauce. Soy sauce, including reduced sodium. Steak sauce. Fish sauce. Oyster sauce. Cocktail sauce. Horseradish. Ketchup and mustard. Meat flavorings and tenderizers. Bouillon cubes. Hot sauce. Tabasco sauce. Marinades. Taco seasonings. Relishes. Fats and  Oils Butter, stick margarine, lard, shortening, ghee, and bacon fat. Coconut, palm kernel, or palm oils. Regular salad dressings. Other Pickles and olives. Salted popcorn and pretzels. The items listed above may not be a complete list of foods and beverages to avoid. Contact your dietitian for more information. WHERE CAN I FIND MORE INFORMATION? National Heart, Lung, and Blood Institute: [employer]   This information is not intended to replace advice given to you by your health care provider. Make sure you discuss any questions you have with your health care provider.   Document Released: 06/28/2011 Document Revised: 07/30/2014 Document Reviewed: 05/13/2013 Elsevier Interactive Patient Education 2016 [employer].   Exercising to [employer] Exercising can help you to lose weight. In order to lose weight through exercise, you need to do vigorous-intensity exercise. You can tell that you are exercising with vigorous intensity if you are breathing very hard and fast and cannot hold a conversation while exercising. Moderate-intensity exercise helps to maintain your current weight. You can tell that you are exercising at a moderate level if you have a higher heart rate and faster breathing, but you are still able to hold a conversation. HOW OFTEN SHOULD I EXERCISE? Choose an activity that you enjoy and set realistic goals. Your health care provider can help you to make an activity plan that works for you. Exercise regularly as directed by your health care provider. This may include:  Doing resistance training twice each week, such as:  Push-ups.  Sit-ups.  Lifting weights.  Using resistance bands.  Doing a given intensity of exercise for a given amount of time. Choose from these options:  150 minutes of moderate-intensity exercise every week.  75 minutes of vigorous-intensity exercise every week.  A mix of moderate-intensity and  vigorous-intensity exercise every week. Children, pregnant women, people who are out of shape, people who are overweight, and older adults may need to consult a health care provider for individual recommendations. If you have any sort of medical condition, be sure to consult your health care provider before starting a new exercise program. WHAT ARE SOME ACTIVITIES THAT CAN HELP ME TO LOSE WEIGHT?   Walking at a rate of at least 4.5 miles an hour.  Jogging or running at a rate of 5 miles per hour.  Biking at a rate of at least 10 miles per hour.  Lap swimming.  Roller-skating or in-line skating.  Cross-country skiing.  Vigorous competitive sports, such as football, basketball, and soccer.  Jumping rope.  Aerobic dancing. HOW CAN I BE MORE ACTIVE IN MY DAY-TO-DAY ACTIVITIES?  Use the stairs instead of the elevator.  Take a walk during your lunch break.  If you drive, park your car farther away from work or school.  If you take public transportation, get off one stop early and walk the rest of the way.  Make all of your phone calls while standing up and walking around.  Get up, stretch, and walk around every 30 minutes throughout the day. WHAT GUIDELINES SHOULD I FOLLOW WHILE EXERCISING?  Do not exercise so much that you hurt yourself, feel dizzy, or get very short of breath.  Consult your health care provider prior to starting a new exercise program.  Wear comfortable clothes and shoes with good support.  Drink plenty of water while you exercise to prevent dehydration or heat stroke. Body water is lost during exercise and must be replaced.  Work out until you breathe faster and your heart beats faster.   This information is not intended to replace advice given to you by your health care provider. Make sure you discuss any questions you have with your health care provider.   Document Released: 08/11/2010 Document Revised: 07/30/2014 Document Reviewed: 12/10/2013 Elsevier  Interactive Patient Education [employer]

## 2015-11-09 LAB — VITAMIN D 25 HYDROXY (VIT D DEFICIENCY, FRACTURES): VIT D 25 HYDROXY: 31 ng/mL (ref 30–100)

## 2015-11-16 ENCOUNTER — Ambulatory Visit: Payer: Self-pay | Attending: Internal Medicine

## 2015-12-20 MED FILL — VALSARTAN-HCTZ 80-12.5 MG T: 80-12.5 | 30 days supply | Qty: 30 | Fill #1

## 2015-12-27 ENCOUNTER — Telehealth: Payer: Self-pay | Admitting: *Deleted

## 2015-12-27 NOTE — Telephone Encounter (Signed)
Patient verified DOB Patient is aware of thyroid, kidneys and liver being normal. Blood count shows no signs of anemia. Patient is also aware of vitamin d being borderline normal compared to 2 years ago and an OTC 100mg  supplement is recommended to keep up the results. Patient expressed her understanding and states she has a FU appointment on the 14th of this month to FU with PCP. No further questions at this time.

## 2015-12-27 NOTE — Telephone Encounter (Signed)
-----   Message from Pete Glatterawn T Langeland, MD sent at 11/09/2015 10:43 AM EDT ----- Please call pt w/ results.  Vit D levels much better, borderline normal now compared to 2 years ago.  Recd taking OTC Vit D 100mg  daily to keep it up.  Kidneys and liver good, thyroid normal, blood count normal no signs of anemia to account for her fatigue.  Please tell her to make appt w/ me in few weeks if still persistently fatigued like she c/o to me. thx.

## 2016-01-04 ENCOUNTER — Ambulatory Visit: Payer: Self-pay | Attending: Family Medicine | Admitting: Family Medicine

## 2016-01-04 ENCOUNTER — Encounter: Payer: Self-pay | Admitting: Family Medicine

## 2016-01-04 VITALS — BP 145/85 | HR 67 | Temp 98.0°F | Resp 14 | Ht 62.5 in | Wt 169.0 lb

## 2016-01-04 DIAGNOSIS — H538 Other visual disturbances: Secondary | ICD-10-CM | POA: Insufficient documentation

## 2016-01-04 DIAGNOSIS — Z79899 Other long term (current) drug therapy: Secondary | ICD-10-CM | POA: Insufficient documentation

## 2016-01-04 DIAGNOSIS — K0381 Cracked tooth: Secondary | ICD-10-CM | POA: Insufficient documentation

## 2016-01-04 DIAGNOSIS — I10 Essential (primary) hypertension: Secondary | ICD-10-CM | POA: Insufficient documentation

## 2016-01-04 LAB — COMPLETE METABOLIC PANEL WITHOUT GFR
ALT: 20 U/L (ref 6–29)
AST: 22 U/L (ref 10–35)
Albumin: 4.3 g/dL (ref 3.6–5.1)
Alkaline Phosphatase: 75 U/L (ref 33–130)
BUN: 7 mg/dL (ref 7–25)
CO2: 28 mmol/L (ref 20–31)
Calcium: 9.4 mg/dL (ref 8.6–10.4)
Chloride: 103 mmol/L (ref 98–110)
Creat: 0.7 mg/dL (ref 0.50–0.99)
GFR, Est African American: >89 mL/min
GFR, Est Non African American: >89 mL/min
Glucose, Bld: 99 mg/dL (ref 65–99)
Potassium: 3.9 mmol/L (ref 3.5–5.3)
Sodium: 140 mmol/L (ref 135–146)
Total Bilirubin: 0.4 mg/dL (ref 0.2–1.2)
Total Protein: 7.7 g/dL (ref 6.1–8.1)

## 2016-01-04 LAB — LIPID PANEL
Cholesterol: 205 mg/dL — ABNORMAL HIGH (ref 125–200)
HDL: 67 mg/dL
LDL Cholesterol: 124 mg/dL
Total CHOL/HDL Ratio: 3.1 ratio
Triglycerides: 69 mg/dL
VLDL: 14 mg/dL

## 2016-01-04 NOTE — Progress Notes (Signed)
Subjective:  Patient ID: Jade Cunningham, female    DOB: 06/01/1956  Age: 60 y.o. MRN: [id number]  CC: Dental Problem and Eye Problem   HPI Jade Cunningham is a 60 year old female with a history of Hypertension who comes into the clinic for an acute visit requesting referral to a dentist due to multiple teeth cracking and falling out. She denies any tooth pain or abscess formation in her gum.  Also complains of "a film over her eyes" which occur intermittently resulting in blurry vision. She currently wears reading glasses which she got from the dollar store.  Outpatient Prescriptions Prior to Visit  Medication Sig Dispense Refill  . acetaminophen (TYLENOL) 500 MG tablet Take 500 mg by mouth every 6 (six) hours as needed for pain.    . bacitracin 500 UNIT/GM ointment Apply 1 application topically 2 (two) times daily. 15 g 1  . Black Cohosh 40 MG CAPS Take 1 capsule by mouth at bedtime.    . COD LIVER OIL PO Take 1 capsule by mouth daily.    . diphenhydrAMINE (BENADRYL) 12.5 MG/5ML liquid Take 12.5 mg by mouth 4 (four) times daily as needed for allergies.    . Ferrous Sulfate (IRON) 325 (65 Fe) MG TABS Take 1 tablet by mouth daily.    Marland Kitchen trimethoprim-polymyxin b (POLYTRIM) ophthalmic solution Place 1 drop into both eyes 4 (four) times daily. 10 mL 0  . valsartan-hydrochlorothiazide (DIOVAN-HCT) 80-12.5 MG tablet Take 1 tablet by mouth daily. Pt will take 1/2 tab in am, and 1/2 tab pm. 90 tablet 3  . vitamin B-12 (CYANOCOBALAMIN) 500 MCG tablet Take 500 mcg by mouth daily.    . Vitamin D, Ergocalciferol, (DRISDOL) 50000 UNITS CAPS capsule Take 1 capsule (50,000 Units total) by mouth every 7 (seven) days. 8 capsule 0  . Vitamin E 200 units TABS Take 1 capsule by mouth daily.    . Cholecalciferol (D3-1000 PO) Take 1,000 Units by mouth once a week. Reported on 01/04/2016    . cyclobenzaprine (FLEXERIL) 10 MG tablet Take 1 tablet (10 mg total) by mouth 3 (three) times daily as needed for muscle  spasms. (Patient not taking: Reported on 11/08/2015) 90 tablet 3  . gabapentin (NEURONTIN) 100 MG capsule Take 1 capsule (100 mg total) by mouth at bedtime. May increase by one capsule every 3-4 days until taking 3 every evening (Patient not taking: Reported on 08/04/2014) 90 capsule 3  . Naproxen Sodium (ALEVE PO) Take by mouth. Reported on 01/04/2016     No facility-administered medications prior to visit.    ROS Review of Systems  Constitutional: Negative for activity change, appetite change and fatigue.  HENT: Positive for dental problem. Negative for congestion, sinus pressure and sore throat.   Eyes: Positive for visual disturbance.  Respiratory: Negative for cough, chest tightness, shortness of breath and wheezing.   Cardiovascular: Negative for chest pain and palpitations.  Gastrointestinal: Negative for abdominal pain, constipation and abdominal distention.  Endocrine: Negative for polydipsia.  Genitourinary: Negative for dysuria and frequency.  Musculoskeletal: Negative for back pain and arthralgias.  Skin: Negative for rash.  Neurological: Negative for tremors, light-headedness and numbness.  Hematological: Does not bruise/bleed easily.  Psychiatric/Behavioral: Negative for behavioral problems and agitation.    Objective:  BP 145/85 mmHg  Pulse 67  Temp(Src) 98 F (36.7 C) (Oral)  Resp 14  Ht 5' 2.5" (1.588 m)  Wt 169 lb (76.658 kg)  BMI 30.40 kg/m2  SpO2 94%  BP/Weight 01/04/2016  11/08/2015 10/14/2015  Systolic BP 145 154 130  Diastolic BP 85 88 57  Wt. (Lbs) 169 172.4 -  BMI 30.4 31.01 -      Physical Exam  Constitutional: She is oriented to person, place, and time. She appears well-developed and well-nourished.  HENT:  Multiple cracked teeth  Eyes: Conjunctivae are normal. Pupils are equal, round, and reactive to light.  Cardiovascular: Normal rate, normal heart sounds and intact distal pulses.   No murmur heard. Pulmonary/Chest: Effort normal and breath  sounds normal. She has no wheezes. She has no rales. She exhibits no tenderness.  Abdominal: Soft. Bowel sounds are normal. She exhibits no distension and no mass. There is no tenderness.  Musculoskeletal: Normal range of motion.  Neurological: She is alert and oriented to person, place, and time.     Assessment & Plan:   1. Blurry vision, bilateral Normal visual acuity Discussed community resources for eye exams as she has no health insurance  2. Nontraumatic broken or cracked tooth No indication for antibiotics. - Ambulatory referral to Dentistry  3. Essential hypertension, benign Blood pressure is slightly elevated which she attributes to being in a hurry and stressed out this morning. No regimen changes at this time. - COMPLETE METABOLIC PANEL WITH GFR - Lipid panel   No orders of the defined types were placed in this encounter.    Follow-up: 3 months follow-up on hypertension  Jaclyn ShaggyEnobong Amao MD

## 2016-01-04 NOTE — Progress Notes (Signed)
Pt here for dental and eye referral. Pt reports that she has been losing teeth. Pt denies pain today. Pt took valsartan and iron this morning

## 2016-01-04 NOTE — Patient Instructions (Signed)
Hypertension Hypertension, commonly called high blood pressure, is when the force of blood pumping through your arteries is too strong. Your arteries are the blood vessels that carry blood from your heart throughout your body. A blood pressure reading consists of a higher number over a lower number, such as 110/72. The higher number (systolic) is the pressure inside your arteries when your heart pumps. The lower number (diastolic) is the pressure inside your arteries when your heart relaxes. Ideally you want your blood pressure below 120/80. Hypertension forces your heart to work harder to pump blood. Your arteries may become narrow or stiff. Having untreated or uncontrolled hypertension can cause heart attack, stroke, kidney disease, and other problems. RISK FACTORS Some risk factors for high blood pressure are controllable. Others are not.  Risk factors you cannot control include:   Race. You may be at higher risk if you are African American.  Age. Risk increases with age.  Gender. Men are at higher risk than women before age 45 years. After age 65, women are at higher risk than men. Risk factors you can control include:  Not getting enough exercise or physical activity.  Being overweight.  Getting too much fat, sugar, calories, or salt in your diet.  Drinking too much alcohol. SIGNS AND SYMPTOMS Hypertension does not usually cause signs or symptoms. Extremely high blood pressure (hypertensive crisis) may cause headache, anxiety, shortness of breath, and nosebleed. DIAGNOSIS To check if you have hypertension, your health care provider will measure your blood pressure while you are seated, with your arm held at the level of your heart. It should be measured at least twice using the same arm. Certain conditions can cause a difference in blood pressure between your right and left arms. A blood pressure reading that is higher than normal on one occasion does not mean that you need treatment. If  it is not clear whether you have high blood pressure, you may be asked to return on a different day to have your blood pressure checked again. Or, you may be asked to monitor your blood pressure at home for 1 or more weeks. TREATMENT Treating high blood pressure includes making lifestyle changes and possibly taking medicine. Living a healthy lifestyle can help lower high blood pressure. You may need to change some of your habits. Lifestyle changes may include:  Following the DASH diet. This diet is high in fruits, vegetables, and whole grains. It is low in salt, red meat, and added sugars.  Keep your sodium intake below 2,300 mg per day.  Getting at least 30-45 minutes of aerobic exercise at least 4 times per week.  Losing weight if necessary.  Not smoking.  Limiting alcoholic beverages.  Learning ways to reduce stress. Your health care provider may prescribe medicine if lifestyle changes are not enough to get your blood pressure under control, and if one of the following is true:  You are 18-59 years of age and your systolic blood pressure is above 140.  You are 60 years of age or older, and your systolic blood pressure is above 150.  Your diastolic blood pressure is above 90.  You have diabetes, and your systolic blood pressure is over 140 or your diastolic blood pressure is over 90.  You have kidney disease and your blood pressure is above 140/90.  You have heart disease and your blood pressure is above 140/90. Your personal target blood pressure may vary depending on your medical conditions, your age, and other factors. HOME CARE INSTRUCTIONS    Have your blood pressure rechecked as directed by your health care provider.   Take medicines only as directed by your health care provider. Follow the directions carefully. Blood pressure medicines must be taken as prescribed. The medicine does not work as well when you skip doses. Skipping doses also puts you at risk for  problems.  Do not smoke.   Monitor your blood pressure at home as directed by your health care provider. SEEK MEDICAL CARE IF:   You think you are having a reaction to medicines taken.  You have recurrent headaches or feel dizzy.  You have swelling in your ankles.  You have trouble with your vision. SEEK IMMEDIATE MEDICAL CARE IF:  You develop a severe headache or confusion.  You have unusual weakness, numbness, or feel faint.  You have severe chest or abdominal pain.  You vomit repeatedly.  You have trouble breathing. MAKE SURE YOU:   Understand these instructions.  Will watch your condition.  Will get help right away if you are not doing well or get worse.   This information is not intended to replace advice given to you by your health care provider. Make sure you discuss any questions you have with your health care provider.   Document Released: 07/09/2005 Document Revised: 11/23/2014 Document Reviewed: 05/01/2013 Elsevier Interactive Patient Education 2016 Elsevier Inc.  

## 2016-01-30 MED FILL — VALSARTAN-HCTZ 80-12.5 MG T: 80-12.5 | 30 days supply | Qty: 30 | Fill #2

## 2016-03-06 MED FILL — ?CYCLOBENZAPRINE 10 MG TABL: 10 | 30 days supply | Qty: 90 | Fill #1

## 2016-03-11 IMAGING — RF DG FLUORO GUIDE NDL PLC/BX
5 series · 5 of 5 positions shown · non-contrast
Comparison: none

CLINICAL DATA: LEFT Scaphoid fracture. Posttraumatic wrist pain.
Scapholunate dysfunction.

[Series 1: (hospital) · 1 of 1 slices shown (1 of 5)]
[im 1/1]
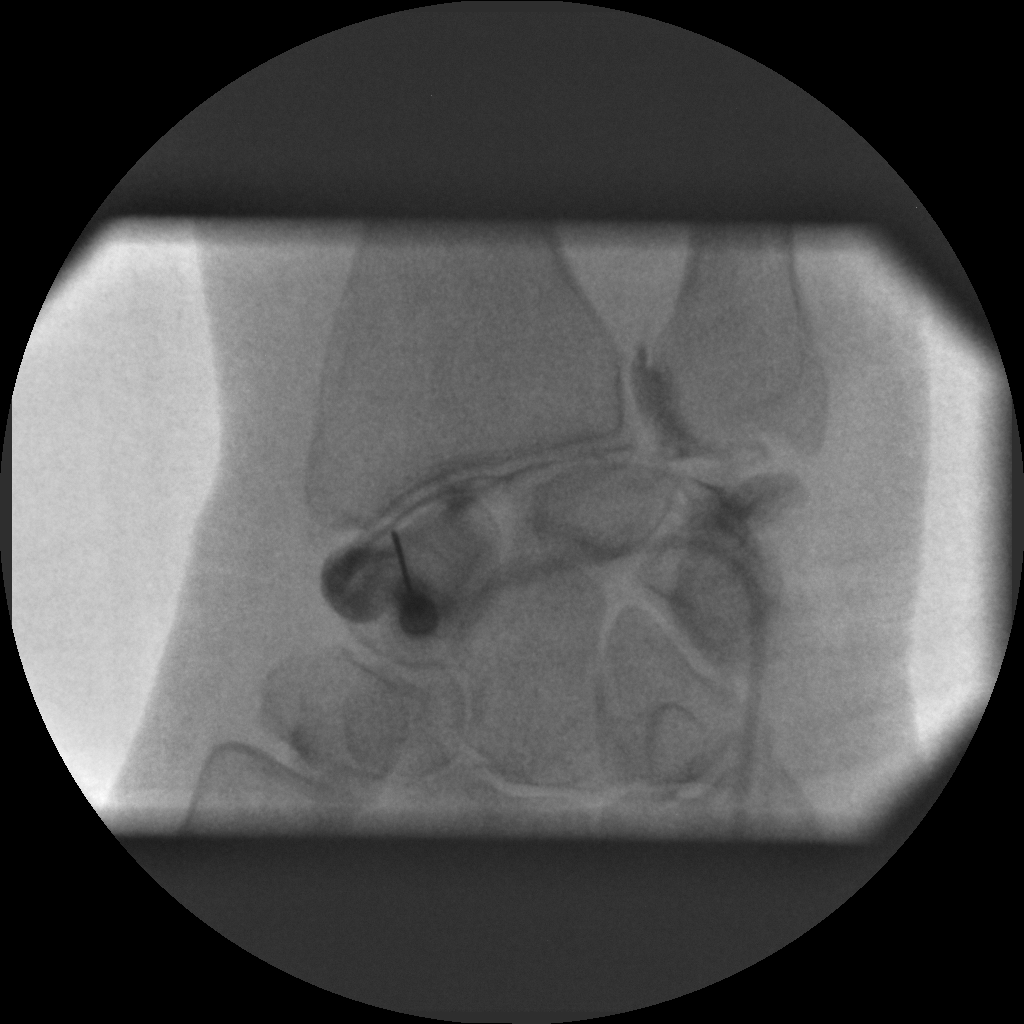

[Series 2: (hospital) · 1 of 1 slices shown (2 of 5)]
[im 1/1]
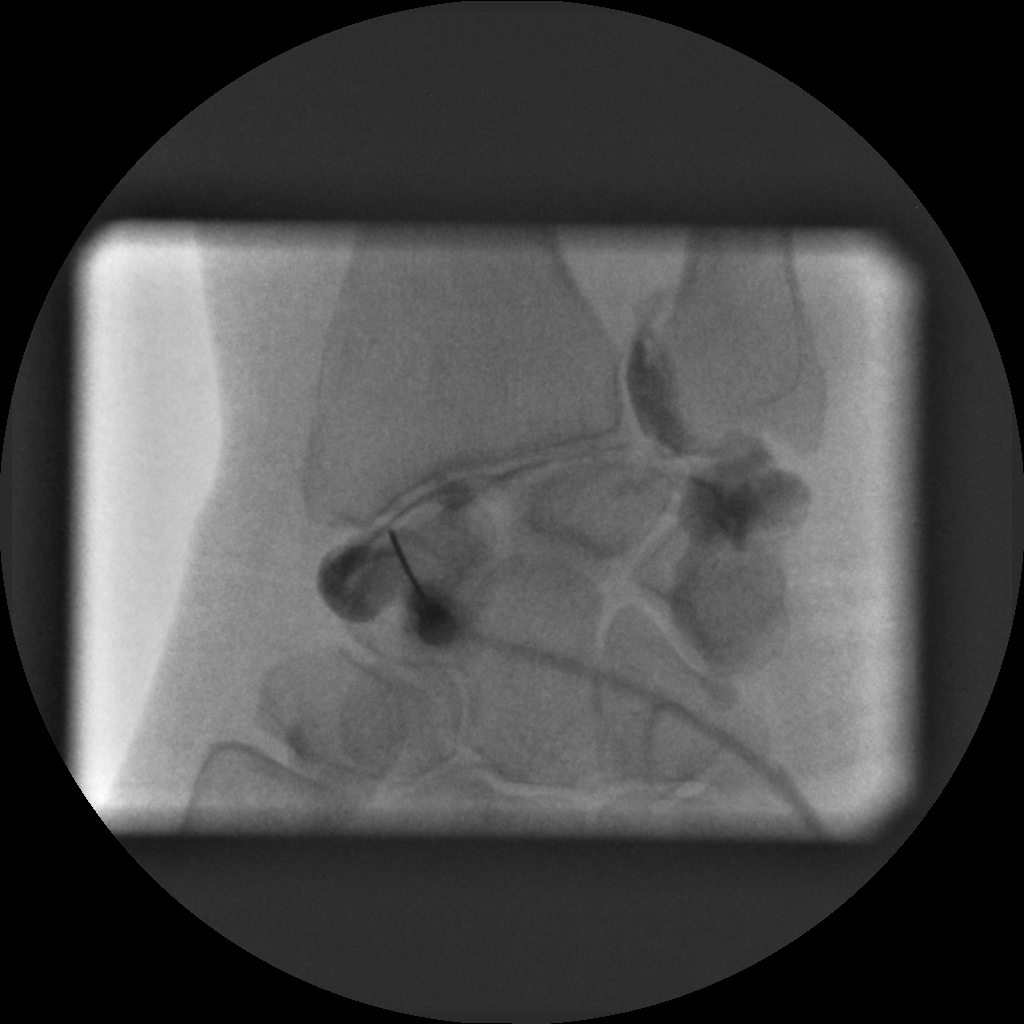

[Series 3: (hospital) · 1 of 1 slices shown (3 of 5)]
[im 1/1]
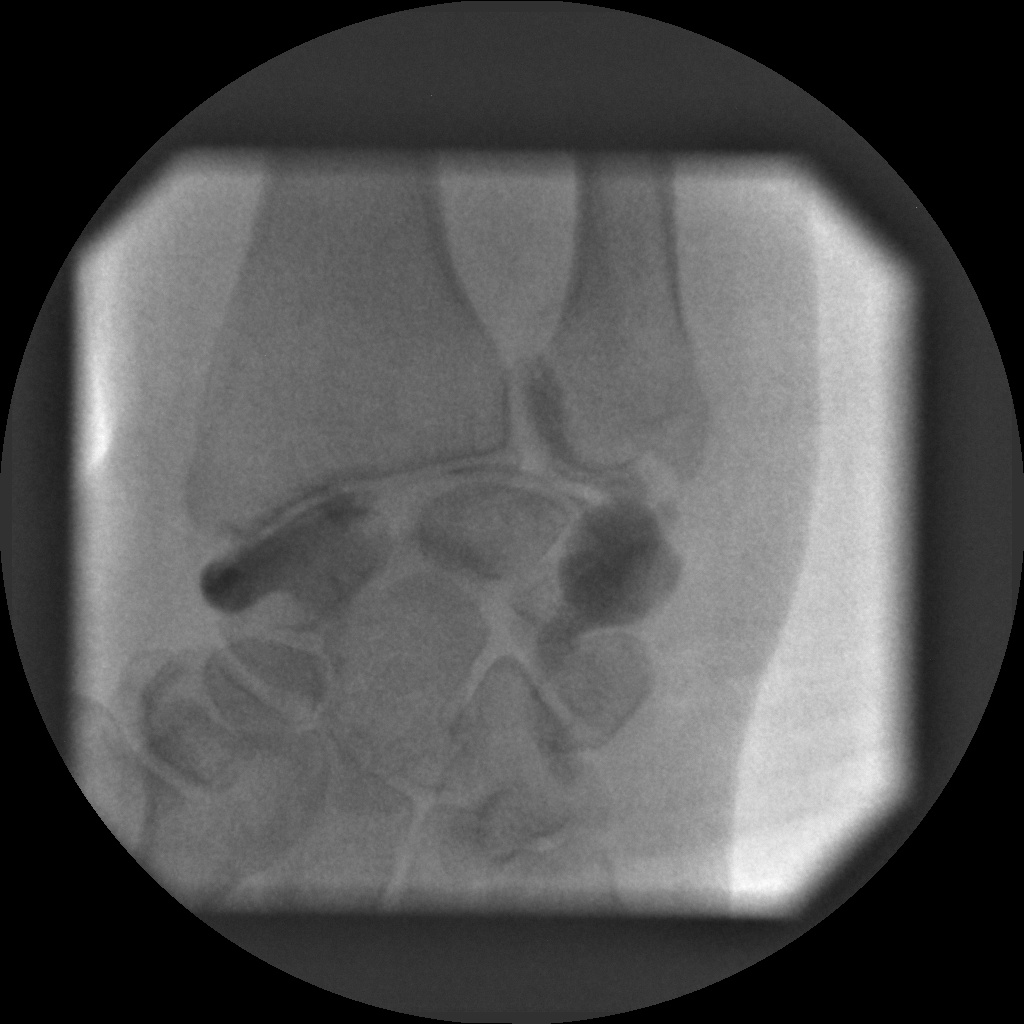

[Series 4: (hospital) · 1 of 1 slices shown (4 of 5)]
[im 1/1]
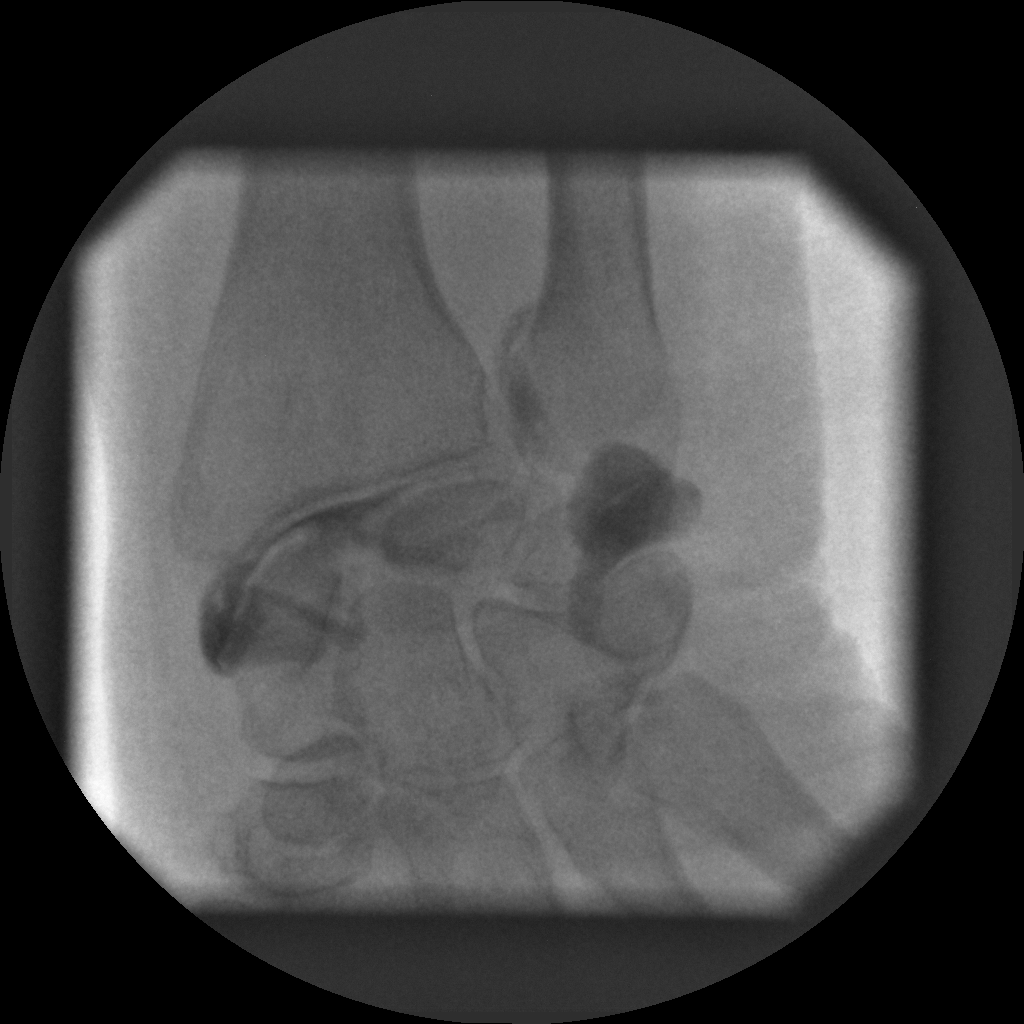

[Series 5: (hospital) · 1 of 1 slices shown (5 of 5)]
[im 1/1]
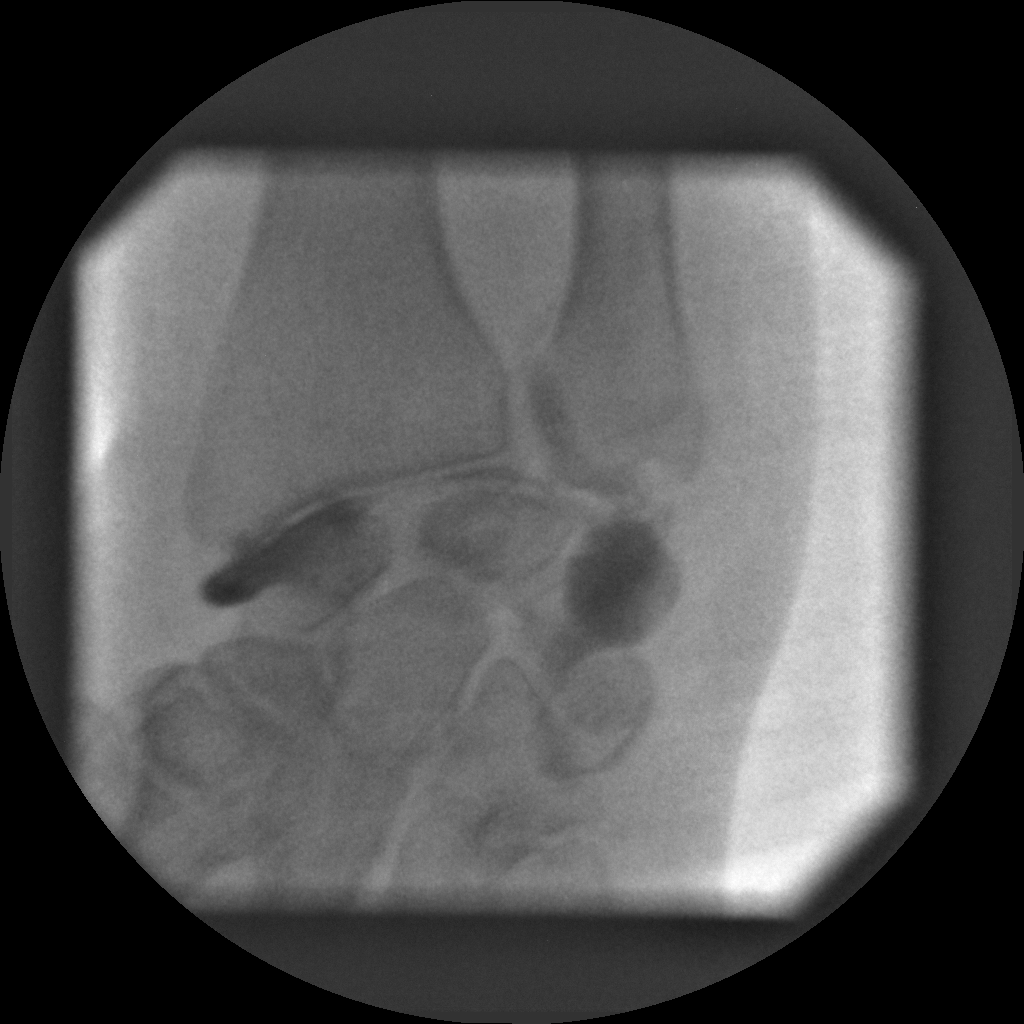

[5 of 5 positions shown; findings below may reference images not displayed]

FLUOROSCOPY TIME:  0 min 49 seconds

PROCEDURE:
LEFT WRIST INJECTION FOR MRI

After a thorough discussion of risks and benefits of the procedure,
written and oral informed consent was obtained. The consent
discussion included the risk of bleeding, infection and injury to
nerves and adjacent blood vessels. Extra-articular injection was
also a possible risk discussed. Verbal consent was obtained by Dr.
Brennan. Time out form Completed when appropriate. We discussed the
high likelihood of good diagnostic study.

Preliminary localization was performed over the LEFT wrist. The area
was marked over dorsal scaphoid.

After prep and drape in the usual sterile fashion, the skin and
deeper subcutaneous tissues were anesthetized with 1% Lidocaine
without Epinephrine. Under fluoroscopic guidance, a 23 gauge
hypodermic needle was advanced into the joint between distal radius
using a dorsal approach. The joint was distended with 1.5 ml of a
[DATE] dilution of Multihance contrast. The MR arthrogram solution
was as follows: 7.5 ml of Omnipaque 180 contrast agent, 0.05 mL
Multihance , 2.5 ml of Lidocaine. After opacification of the joint,
injection was discontinued, the needle removed, and a sterile
dressing applied. The patient was taken to MRI for subsequent
imaging.

The patient tolerated the procedure well and there were no
complications.

Central perforation of the triangular fibrocartilage was present
with extravasation of contrast into the distal radioulnar joint.
During arthrography, it no contrast was seen in the distal carpal
row.
IMPRESSION: Successful LEFT wrist fluoroscopically guided injection.

## 2016-03-27 ENCOUNTER — Telehealth: Payer: Self-pay | Admitting: Family Medicine

## 2016-03-27 NOTE — Telephone Encounter (Signed)
Pt called requesting change of medication for bp, pt states it is now more expensive for her and has caused her to have a yeast infection. Please f/up

## 2016-03-28 MED ORDER — LISINOPRIL-HYDROCHLOROTHIAZIDE 20-25 MG PO TABS: 1.0000 | ORAL_TABLET | Freq: Every day | ORAL | 3 refills | Status: AC

## 2016-03-28 NOTE — Telephone Encounter (Signed)
Antihypertensive changed as per patient request.

## 2016-03-29 ENCOUNTER — Telehealth: Payer: Self-pay | Admitting: Family Medicine

## 2016-03-29 MED FILL — LISINOPRIL-HCTZ 20-25 MG TA: 20-25 | 30 days supply | Qty: 30 | Fill #0

## 2016-03-29 NOTE — Telephone Encounter (Signed)
Writer called back and LVM that her antihypertensive medication was changed per her request.  If she would like to make another appt to come in to discuss with MD she can call- Clinical research associatewriter provided patient with the phone number.

## 2016-03-29 NOTE — Telephone Encounter (Signed)
Pt. Called stating that she was prescribed a new BP medication and she needs clarification.  Pt. Also stated that she was taking valsartan-hydrochlorothiazide (DIOVAN-HCT) 80-12.5 MG tablet  And she needs to explain to her PCP that the Rx number was different this time then what it usually is.  Pt. States is the same medication but different stock number and it was making her have a yeast infection.  Please f/u with pt.

## 2016-03-30 NOTE — Telephone Encounter (Signed)
Please review notes from 03/27/16 and discussed with patient accordingly. Thank you

## 2016-04-04 ENCOUNTER — Telehealth: Payer: Self-pay

## 2016-04-04 NOTE — Telephone Encounter (Signed)
Patient called today requesting that she stay on losartan/HCTZ and not be switched to lisinopril/HCTZ  she is experiencing a yeast infection because she states that the Lot# is different from the last prescription she took.  She also states that the medication went from costing her out of pocket $4.00 to $10.00. [facility] states that she explained  to the patient why the medication lot # changes and that she is receiving the same medication she has been getting. Deyonna agreed to calling back patient this afternoon since she had the original conversation with her.

## 2016-04-23 ENCOUNTER — Telehealth: Payer: Self-pay | Admitting: Family Medicine

## 2016-04-23 NOTE — Telephone Encounter (Signed)
Pt calling stating she can no longer take the Lisinopril Rx Pt states she broke the pill in fourths and the medication kept her from sleeping and caused nausea and stomach pains Pt also states she heard Lisinopril was recalled and is concerned as to why she was prescribed the medication

## 2016-04-24 NOTE — Telephone Encounter (Signed)
She has hypertension and needs to stay on an antihypertensive if not she is at risk of a stroke and other cardiac events; if she is unable to tolerate lisinopril she needs an office visit to discuss a substitute. If she heard lisinopril was recalled she would have to check her bottle with her pharmacist as this would usually affect certain batches.

## 2016-04-24 NOTE — Telephone Encounter (Signed)
Writer spoke with patient regarding the lisinopril.  Patient [place] is in [place] and has decided to stay there.  Writer stressed that patient should be on a BP medication and patient stated that she is trying "natural" supplements because she is "unable to take generic medication" and was given this type of medication from our pharmacy.  She states that she "will not be taking lisinopril ever again".  Writer encouraged her to call if we can help in any way for the future.  Patient stated understanding.

## 2017-07-08 ENCOUNTER — Encounter (HOSPITAL_COMMUNITY): Payer: Self-pay
